# Patient Record
Sex: Male | Born: 1970
Health system: Southern US, Community
[De-identification: ages and names within clinical notes are randomized; demographics above are authoritative.]

## PROBLEM LIST (undated history)

## (undated) DIAGNOSIS — E785 Hyperlipidemia, unspecified: Secondary | ICD-10-CM

## (undated) DIAGNOSIS — I10 Essential (primary) hypertension: Secondary | ICD-10-CM

## (undated) HISTORY — DX: Essential (primary) hypertension: I10

## (undated) HISTORY — DX: Hyperlipidemia, unspecified: E78.5

## (undated) HISTORY — PX: COLONOSCOPY: SHX174

---

## 1999-05-25 ENCOUNTER — Encounter: Payer: Self-pay | Admitting: Family Medicine

## 1999-05-25 ENCOUNTER — Ambulatory Visit (HOSPITAL_COMMUNITY): Admission: RE | Admit: 1999-05-25 | Discharge: 1999-05-25 | Payer: Self-pay | Admitting: Family Medicine

## 1999-05-26 ENCOUNTER — Emergency Department (HOSPITAL_COMMUNITY): Admission: EM | Admit: 1999-05-26 | Discharge: 1999-05-26 | Payer: Self-pay | Admitting: Emergency Medicine

## 2000-03-28 ENCOUNTER — Emergency Department (HOSPITAL_COMMUNITY): Admission: EM | Admit: 2000-03-28 | Discharge: 2000-03-28 | Payer: Self-pay | Admitting: Emergency Medicine

## 2010-03-14 HISTORY — PX: ROTATOR CUFF REPAIR: SHX139

## 2010-03-25 ENCOUNTER — Ambulatory Visit (HOSPITAL_COMMUNITY)
Admission: RE | Admit: 2010-03-25 | Discharge: 2010-03-25 | Payer: Self-pay | Source: Home / Self Care | Attending: Specialist | Admitting: Specialist

## 2010-11-08 ENCOUNTER — Encounter: Payer: Self-pay | Admitting: Critical Care Medicine

## 2010-11-09 ENCOUNTER — Institutional Professional Consult (permissible substitution): Payer: Self-pay | Admitting: Critical Care Medicine

## 2010-11-09 ENCOUNTER — Ambulatory Visit (INDEPENDENT_AMBULATORY_CARE_PROVIDER_SITE_OTHER): Payer: BC Managed Care – PPO | Admitting: Critical Care Medicine

## 2010-11-09 ENCOUNTER — Encounter: Payer: Self-pay | Admitting: Critical Care Medicine

## 2010-11-09 ENCOUNTER — Ambulatory Visit (INDEPENDENT_AMBULATORY_CARE_PROVIDER_SITE_OTHER)
Admission: RE | Admit: 2010-11-09 | Discharge: 2010-11-09 | Disposition: A | Discharge status: Home / Self Care | Payer: BC Managed Care – PPO | Source: Ambulatory Visit | Attending: Critical Care Medicine | Admitting: Critical Care Medicine

## 2010-11-09 DIAGNOSIS — J019 Acute sinusitis, unspecified: Secondary | ICD-10-CM | POA: Insufficient documentation

## 2010-11-09 DIAGNOSIS — J209 Acute bronchitis, unspecified: Secondary | ICD-10-CM

## 2010-11-09 DIAGNOSIS — F17200 Nicotine dependence, unspecified, uncomplicated: Secondary | ICD-10-CM | POA: Insufficient documentation

## 2010-11-09 DIAGNOSIS — Z720 Tobacco use: Secondary | ICD-10-CM

## 2010-11-09 MED ORDER — VARENICLINE TARTRATE 0.5 MG PO TABS: 0.5000 mg | ORAL_TABLET | Freq: Two times a day (BID) | ORAL | Status: AC

## 2010-11-09 MED ORDER — BUDESONIDE 180 MCG/ACT IN AEPB: 2.0000 | INHALATION_SPRAY | Freq: Two times a day (BID) | RESPIRATORY_TRACT | Status: DC

## 2010-11-09 MED ORDER — PREDNISONE 10 MG PO TABS: ORAL_TABLET | ORAL | Status: DC

## 2010-11-09 NOTE — Assessment & Plan Note (Addendum)
Acute tracheobronchitis with airway obstruction and cyclical cough , assoc chronic sinusitis ppt by smoking use Note obstruction on spiro 8/12  Plan Start pulmicort two puff twice daily Prednisone 10mg  Take 4 for two days, three for two days, two for two days, then one for two days and stop Finish azithromycin Stop smoking , use chantix Proair as needed Return 2 months

## 2010-11-09 NOTE — Assessment & Plan Note (Signed)
Trial chantix for smoking cessation

## 2010-11-09 NOTE — Progress Notes (Signed)
Subjective:    Patient ID: Ryan Fisher, male    DOB: Aug 17, 1970, 40 y.o.   MRN: [id number] 40 y.o. WM chronic cough Cough This is a chronic problem. The current episode started more than 1 year ago. The problem has been waxing and waning (comes and goes,  once a year, occ weekly.). The problem occurs every few hours. The cough is productive of sputum (Is productive of mucus). Associated symptoms include chills, ear pain, a fever, headaches, myalgias, postnasal drip, rhinorrhea, shortness of breath and wheezing. Pertinent negatives include no chest pain, heartburn, hemoptysis, nasal congestion, rash or sore throat. The symptoms are aggravated by fumes and dust. Risk factors for lung disease include smoking/tobacco exposure. He has tried a beta-agonist inhaler (inhaler does not help much.) for the symptoms. The treatment provided mild relief. His past medical history is significant for bronchitis. There is no history of asthma, COPD, emphysema, environmental allergies or pneumonia.    Past Medical History  Diagnosis Date  . Acute bronchitis   . Acute serous otitis media   . Acute sinusitis, unspecified   . Pain in joint, shoulder region      Family History  Problem Relation Age of Onset  . Allergies Brother   . Asthma Brother   . Heart failure Paternal Grandfather   . Brain cancer Maternal Aunt   . Arthritis Mother      History   Social History  . Marital Status: Single    Spouse Name: N/A    Number of Children: N/A  . Years of Education: N/A   Occupational History  . 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 Smoking status: Current Everyday Smoker -- 1.0 packs/day    Types: Cigarettes, Cigars  . Smokeless tobacco: Former Neurosurgeon    Types: Snuff    Quit date: 03/14/2009   Comment: started smoking at age 59  . Alcohol Use: Yes     every couple weeks  . Drug Use: No     quit at age 58  . Sexually Active: Not on file   Other Topics  Concern  . Not on file   Social History Narrative  . No narrative on file     Allergies  Allergen Reactions  . Morphine And Related      Outpatient Prescriptions Prior to Visit  Medication Sig Dispense Refill  . albuterol (PROAIR HFA) 108 (90 BASE) MCG/ACT inhaler Inhale 2 puffs into the lungs every 4 (four) hours as needed.        . cyclobenzaprine (FLEXERIL) 10 MG tablet Take 10 mg by mouth 3 (three) times daily.        . fluticasone (FLONASE) 50 MCG/ACT nasal spray Place 2 sprays into the nose daily.            Review of Systems  Constitutional: Positive for fever, chills and diaphoresis. Negative for activity change, appetite change, fatigue and unexpected weight change.  HENT: Positive for ear pain, congestion, rhinorrhea, trouble swallowing, dental problem, voice change, postnasal drip, sinus pressure and tinnitus. Negative for hearing loss, nosebleeds, sore throat, facial swelling, sneezing, mouth sores, neck pain, neck stiffness and ear discharge.   Eyes: Negative for photophobia, discharge, itching and visual disturbance.  Respiratory: Positive for cough, chest tightness, shortness of breath and wheezing. Negative for apnea, hemoptysis, choking and stridor.   Cardiovascular: Negative for chest pain, palpitations and leg swelling.  Gastrointestinal: Positive for nausea and constipation. Negative for heartburn,  vomiting, abdominal pain, blood in stool and abdominal distention.  Genitourinary: Positive for hematuria. Negative for dysuria, urgency, frequency, flank pain, decreased urine volume and difficulty urinating.  Musculoskeletal: Positive for myalgias, back pain and arthralgias. Negative for joint swelling and gait problem.  Skin: Positive for pallor. Negative for color change and rash.  Neurological: Positive for dizziness, weakness, light-headedness and headaches. Negative for tremors, seizures, syncope, speech difficulty and numbness.  Hematological: Negative for  environmental allergies and adenopathy. Does not bruise/bleed easily.  Psychiatric/Behavioral: Negative for confusion, sleep disturbance and agitation. The patient is not nervous/anxious.        Objective:   Physical Exam  Filed Vitals:   11/09/10 0949  BP: 122/86  Pulse: 79  Temp: 99 F (37.2 C)  Height: 6\' 3"  (1.905 m)  Weight: 301 lb 9.6 oz (136.805 kg)  SpO2: 97%    Gen: Pleasant, well-nourished, in no distress,  normal affect  ENT: bilateral nasal purulence,  mouth clear,  oropharynx clear, + postnasal drip  Neck: No JVD, no TMG, no carotid bruits  Lungs: No use of accessory muscles, no dullness to percussion, exp wheeze Cardiovascular: RRR, heart sounds normal, no murmur or gallops, no peripheral edema  Abdomen: soft and NT, no HSM,  BS normal  Musculoskeletal: No deformities, no cyanosis or clubbing  Neuro: alert, non focal  Skin: Warm, no lesions or rashes  8/28 CXR No active disease 8/28 spiro: moderate peripheral airflow obstruction      Assessment & Plan:   Acute bronchitis Acute tracheobronchitis with airway obstruction and cyclical cough , assoc chronic sinusitis ppt by smoking use Note obstruction on spiro 8/12  Plan Start pulmicort two puff twice daily Prednisone 10mg  Take 4 for two days, three for two days, two for two days, then one for two days and stop Finish azithromycin Stop smoking , use chantix Proair as needed Return 2 months   Nicotine addiction Trial chantix for smoking cessation    Updated Medication List Outpatient Encounter Prescriptions as of 11/09/2010  Medication Sig Dispense Refill  . albuterol (PROAIR HFA) 108 (90 BASE) MCG/ACT inhaler Inhale 2 puffs into the lungs every 4 (four) hours as needed.        Marland Kitchen azithromycin (ZITHROMAX) 250 MG tablet Take as directed      . cyclobenzaprine (FLEXERIL) 10 MG tablet Take 10 mg by mouth 3 (three) times daily.        . fluticasone (FLONASE) 50 MCG/ACT nasal spray Place 2 sprays  into the nose daily.        Marland Kitchen omeprazole (PRILOSEC) 20 MG capsule Take 1 capsule by mouth Daily. Take 30 minutes before breakfast      . budesonide (PULMICORT FLEXHALER) 180 MCG/ACT inhaler Inhale 2 puffs into the lungs 2 (two) times daily.  1 each  6  . predniSONE (DELTASONE) 10 MG tablet Take 4 for two days, three for two days, two for two days, then one for two days and stop   20 tablet  0  . varenicline (CHANTIX) 0.5 MG tablet Take 1 tablet (0.5 mg total) by mouth 2 (two) times daily.  60 tablet  2

## 2010-11-09 NOTE — Patient Instructions (Signed)
Start pulmicort two puff twice daily Prednisone 10mg  Take 4 for two days, three for two days, two for two days, then one for two days and stop Finish azithromycin Stop smoking , use chantix Proair as needed Return 2 months

## 2012-08-11 IMAGING — CR DG CHEST 2V
2 series · 2 of 2 positions shown · non-contrast
Comparison: 08/27/2007

CLINICAL DATA: Chronic cough.  Smoker.

CHEST - 2 VIEW

[view not recorded (1 of 2)]
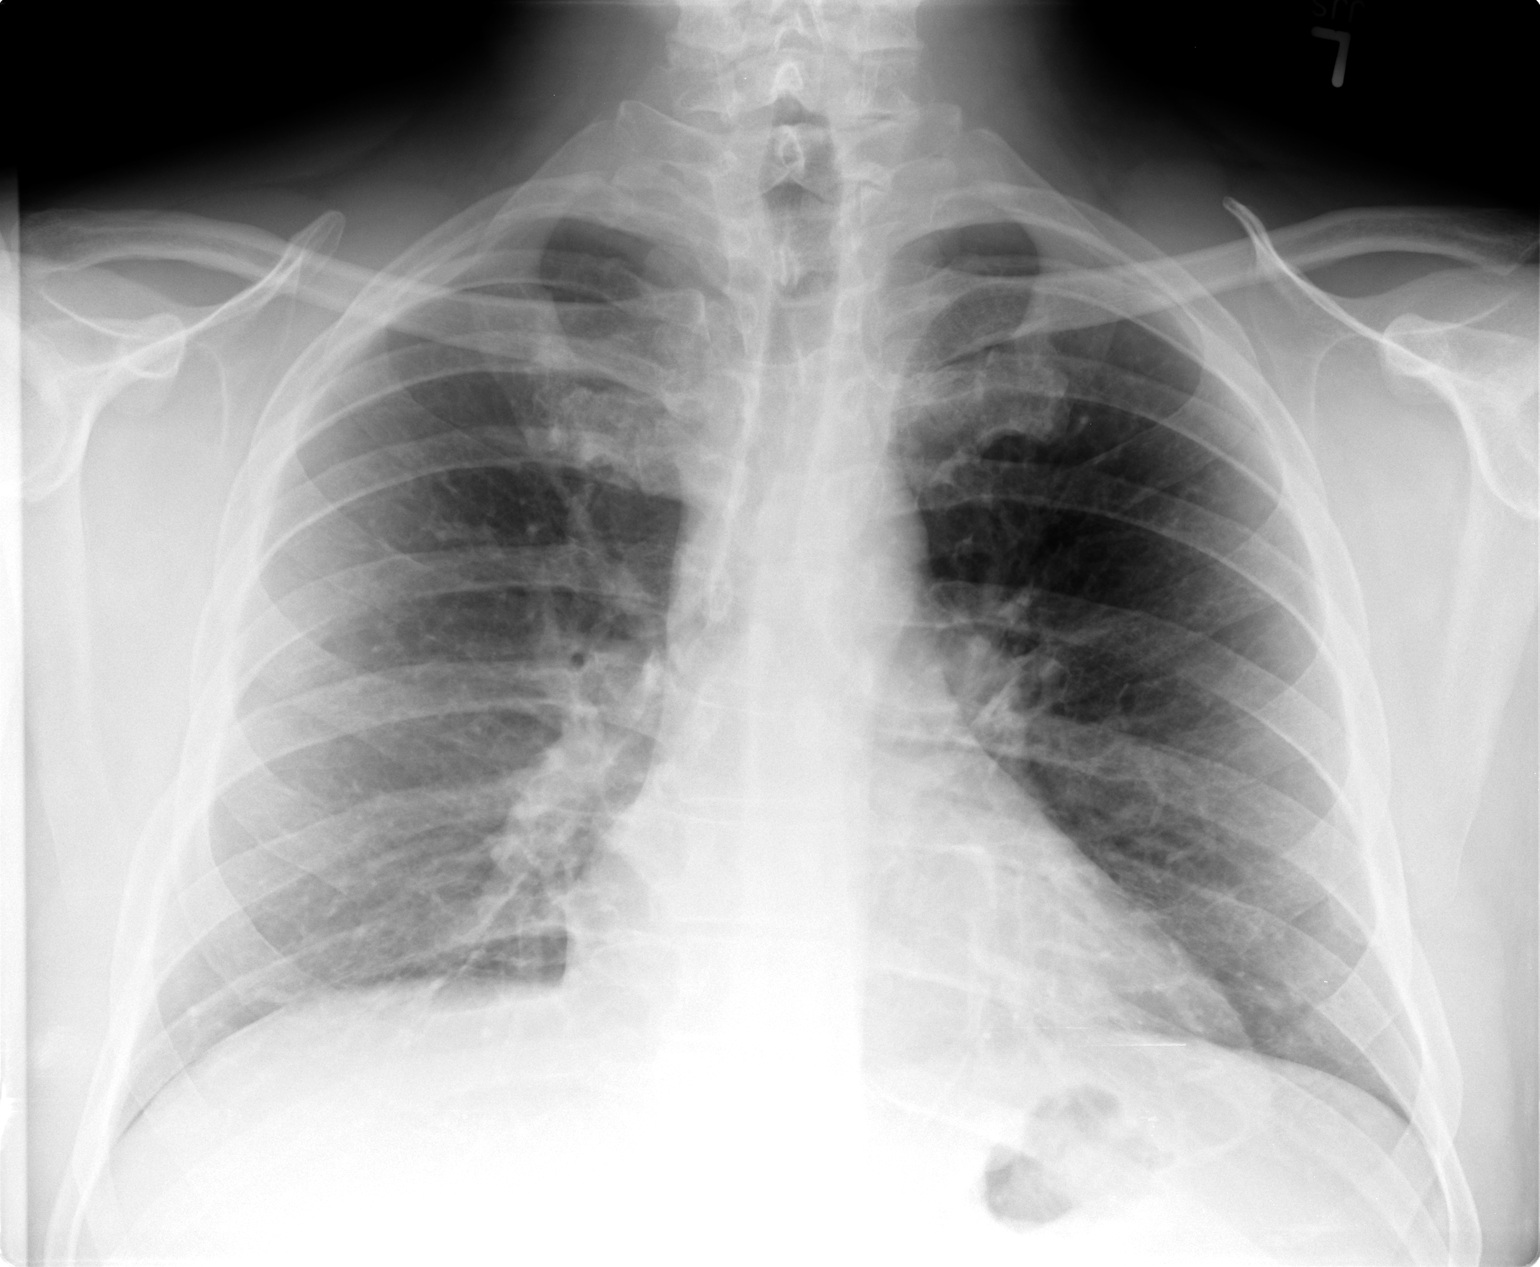

[view not recorded (2 of 2)]
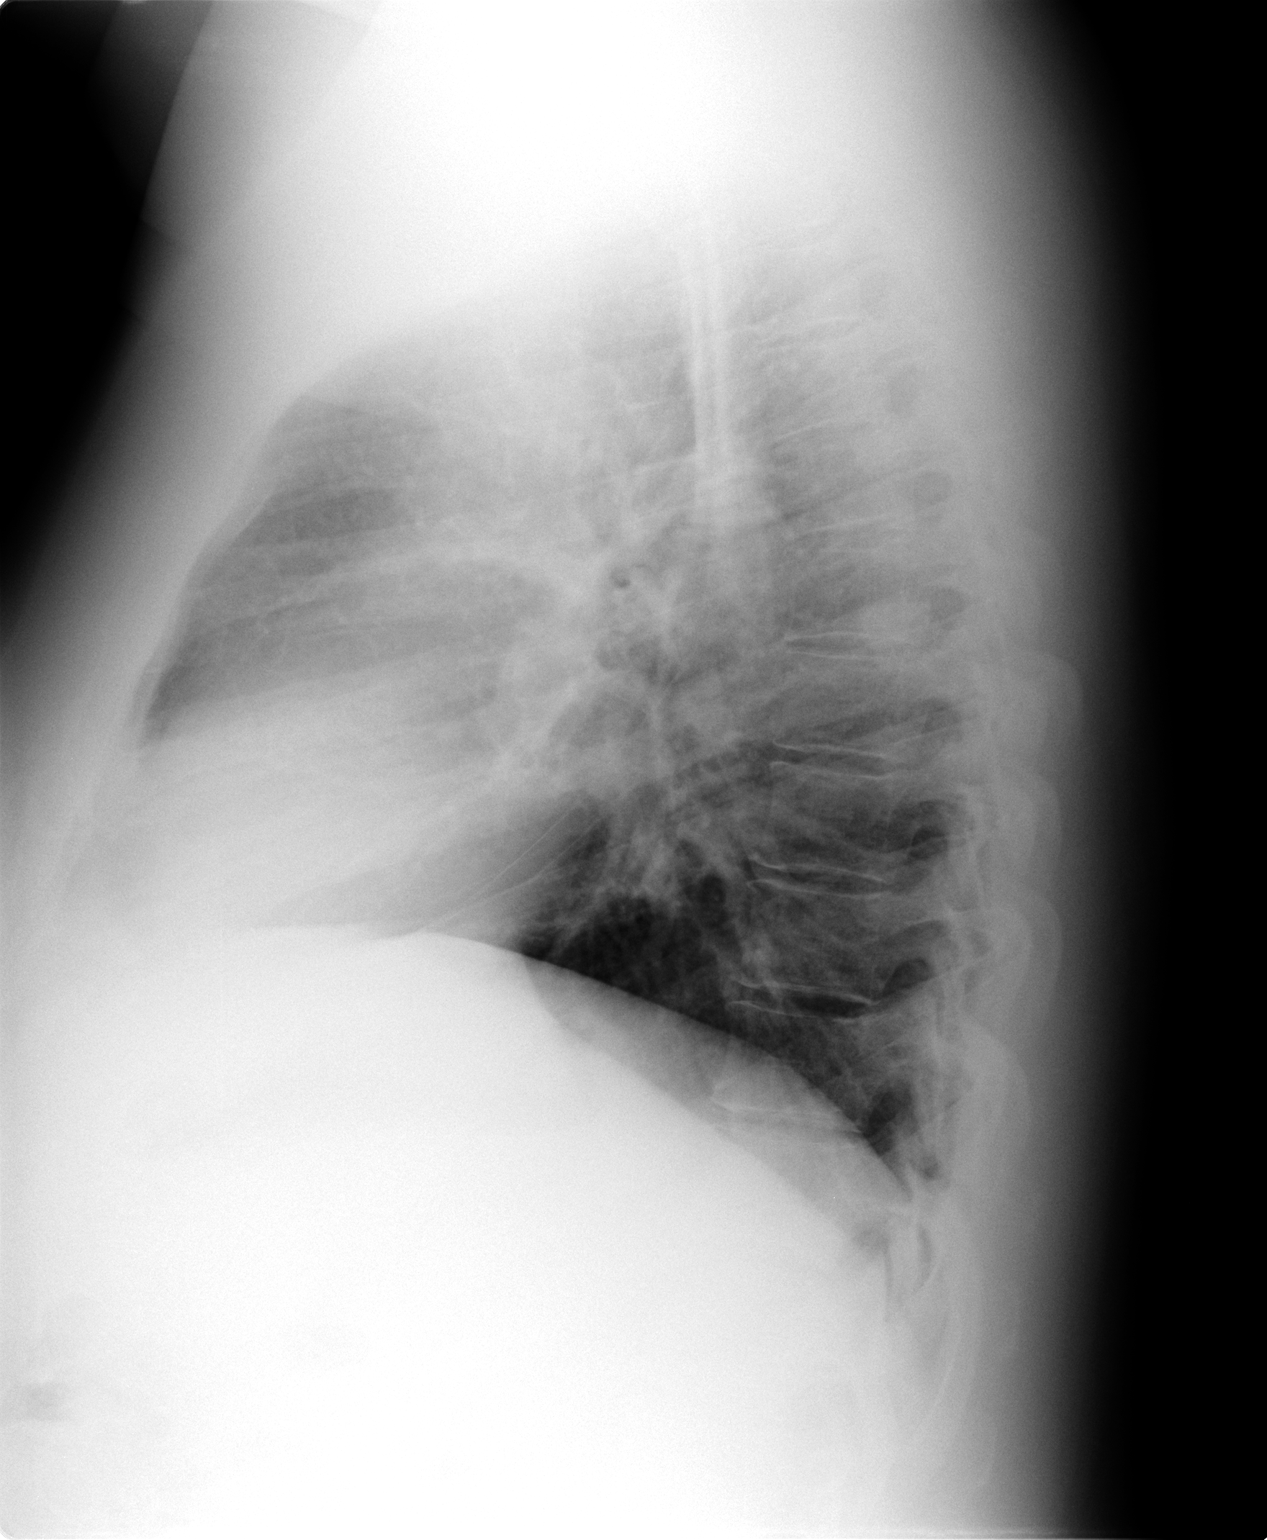

[2 of 2 positions shown; findings below may reference images not displayed]

FINDINGS: Low lung volumes are noted, however both lungs are clear.
No evidence of pleural effusion.  Heart size is within normal
limits.  No mass or lymphadenopathy identified.
IMPRESSION: Stable exam.  No active disease.

## 2016-04-28 DIAGNOSIS — J069 Acute upper respiratory infection, unspecified: Secondary | ICD-10-CM | POA: Diagnosis not present

## 2016-04-28 DIAGNOSIS — R062 Wheezing: Secondary | ICD-10-CM | POA: Diagnosis not present

## 2016-06-13 DIAGNOSIS — K0889 Other specified disorders of teeth and supporting structures: Secondary | ICD-10-CM | POA: Diagnosis not present

## 2016-08-30 ENCOUNTER — Ambulatory Visit (INDEPENDENT_AMBULATORY_CARE_PROVIDER_SITE_OTHER): Payer: BLUE CROSS/BLUE SHIELD | Admitting: Family Medicine

## 2016-08-30 ENCOUNTER — Encounter: Payer: Self-pay | Admitting: Family Medicine

## 2016-08-30 VITALS — BP 155/95 | HR 80 | Temp 98.3°F | Resp 20 | Ht 75.0 in | Wt 308.8 lb

## 2016-08-30 DIAGNOSIS — Z6838 Body mass index (BMI) 38.0-38.9, adult: Secondary | ICD-10-CM | POA: Diagnosis not present

## 2016-08-30 DIAGNOSIS — F17200 Nicotine dependence, unspecified, uncomplicated: Secondary | ICD-10-CM

## 2016-08-30 DIAGNOSIS — Z7689 Persons encountering health services in other specified circumstances: Secondary | ICD-10-CM

## 2016-08-30 DIAGNOSIS — I10 Essential (primary) hypertension: Secondary | ICD-10-CM | POA: Diagnosis not present

## 2016-08-30 DIAGNOSIS — Z87891 Personal history of nicotine dependence: Secondary | ICD-10-CM | POA: Insufficient documentation

## 2016-08-30 LAB — COMPREHENSIVE METABOLIC PANEL
ALBUMIN: 4.4 g/dL (ref 3.5–5.2)
ALT: 35 U/L (ref 0–53)
AST: 28 U/L (ref 0–37)
Alkaline Phosphatase: 68 U/L (ref 39–117)
BUN: 14 mg/dL (ref 6–23)
CHLORIDE: 106 meq/L (ref 96–112)
CO2: 28 meq/L (ref 19–32)
Calcium: 9.9 mg/dL (ref 8.4–10.5)
Creatinine, Ser: 1.19 mg/dL (ref 0.40–1.50)
GFR: 70.15 mL/min (ref >60.00)
GLUCOSE: 162 mg/dL — AB (ref 70–99)
POTASSIUM: 4 meq/L (ref 3.5–5.1)
SODIUM: 142 meq/L (ref 135–145)
Total Bilirubin: 0.6 mg/dL (ref 0.2–1.2)
Total Protein: 7 g/dL (ref 6.0–8.3)

## 2016-08-30 LAB — CBC
HCT: 45.9 % (ref 39.0–52.0)
Hemoglobin: 15.7 g/dL (ref 13.0–17.0)
MCHC: 34.3 g/dL (ref 30.0–36.0)
MCV: 89.1 fl (ref 78.0–100.0)
Platelets: 276 10*3/uL (ref 150.0–400.0)
RBC: 5.15 Mil/uL (ref 4.22–5.81)
RDW: 14.1 % (ref 11.5–15.5)
WBC: 8.6 10*3/uL (ref 4.0–10.5)

## 2016-08-30 LAB — TSH: TSH: 2.48 u[IU]/mL (ref 0.35–4.50)

## 2016-08-30 MED ORDER — BD ASSURE BPM/AUTO ARM CUFF MISC: 0 refills | Status: DC

## 2016-08-30 MED ORDER — LISINOPRIL 5 MG PO TABS: 5.0000 mg | ORAL_TABLET | Freq: Every day | ORAL | 0 refills | Status: DC

## 2016-08-30 NOTE — Progress Notes (Signed)
Patient ID: Ryan Fisher, male  DOB: [birth year], 46 y.o.   MRN: [id number] Patient Care Team    Relationship Specialty Notifications Start End  Ma Hillock, DO PCP - General Family Medicine  08/30/16   Elsie Stain, MD  Pulmonary Disease Abnormal results only, Admissions 11/09/10     Chief Complaint  Patient presents with  . Establish Care  . Hypertension    Subjective:  Ryan Fisher is a 46 y.o.  male present for new patient establishment. All past medical history, surgical history, allergies, family history, immunizations, medications and social history were obtained and entered in the electronic medical record today. All recent labs, ED visits and hospitalizations within the last year were reviewed.  New onset Hypertension:  Pt reports he has not had a primary care doctor. 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 They do not perform labs except urine for protein, which he states has always been normal. He reports his BPs have been borderline over the past couple years. However, the last physical his BP was 156/91 and was encouraged to establish with a PCP. Patient denies chest pain, shortness of breath or lower extremity edema. Pt does not take a  daily baby ASA. Pt is not prescribed statin. BMP: No prior labs CBC: no prior labs Lipids: no prior labs Diet: admits he has never watched his sodium.  Exercise: Does not exercise routinely.  RF: Smoker, HTN, FHX HD. Obesity.  Tobacco abuse disorder:  Pt reports he has smoker since he was about 16 or so averaging about 22 years of smoking over the years at 1 PPD. He tried to quit smoking around 2012 with chantix, but had severe side effects to medication including nightmares and mood changes. His wife also smokes, making it difficult to quit. He does smoke when wakes up in the morning. He does not desire to quit yet, but he would like to try sometime again if he can talk his wife into also quitting with him.   Health  maintenance:  Colonoscopy: No fhx, screen at 50. Immunizations: tdap 03/2013, Influenza (encouraged yearly), PNA series everyday smoker should be offered Infectious disease screening: HIV will offer at CPE PSA: No results found for: PSA, never. Will offer at cpe, no fhx.    Depression screen [facility] 2/9 08/30/2016  Decreased Interest 0  Down, Depressed, Hopeless 0  PHQ - 2 Score 0   No flowsheet data found.  Current Exercise Habits: The patient does not participate in regular exercise at present Exercise limited by: None identified No flowsheet data found.    There is no immunization history on file for this patient.  No exam data present  History reviewed. No pertinent past medical history. Allergies  Allergen Reactions  . Morphine And Related    Past Surgical History:  Procedure Laterality Date  . ROTATOR CUFF REPAIR Left 2012   left   Family History  Problem Relation Age of Onset  . Allergies Brother   . Asthma Brother   . Heart failure Paternal Grandfather   . Brain cancer Maternal Aunt   . Arthritis Mother   . Early death Paternal Uncle    Social History   Social History  . Marital status: Married    Spouse name: Lola  . Number of children: N/A  . Years of education: 72   Occupational History  . 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 Smoking status:  Former Smoker    Packs/day: 1.00    Years: 22.00    Types: Cigarettes, Cigars    Start date: 03/14/1994  . Smokeless tobacco: Former Systems developer    Types: Snuff    Quit date: 03/14/2009  . Alcohol use 3.6 oz/week    6 Cans of beer per week     Comment: every couple weeks  . Drug use: No     Comment: quit at age 72  . Sexual activity: Yes    Partners: Female   Other Topics Concern  . Not on file   Social History Narrative   Married to [place].    Some college attendance. 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    Drinks caffeine.    Wears a seatbelt and  bicycle helmet.    Smoke detector in the home. Firearms locked in the home.    Feels safe in his relationships.       Allergies as of 08/30/2016      Reactions   Morphine And Related       Medication List       Accurate as of 08/30/16  5:22 PM. Always use your most recent med list.          B-D ASSURE BPM/AUTO ARM CUFF Misc BP cuff XL.   lisinopril 5 MG tablet Commonly known as:  PRINIVIL,ZESTRIL Take 1 tablet (5 mg total) by mouth daily.       All past medical history, surgical history, allergies, family history, immunizations andmedications were updated in the EMR today and reviewed under the history and medication portions of their EMR.    Recent Results (from the past 2160 hour(s))  CBC     Status: None   Collection Time: 08/30/16  2:36 PM  Result Value Ref Range   WBC 8.6 4.0 - 10.5 K/uL   RBC 5.15 4.22 - 5.81 Mil/uL   Platelets 276.0 150.0 - 400.0 K/uL   Hemoglobin 15.7 13.0 - 17.0 g/dL   HCT 45.9 39.0 - 52.0 %   MCV 89.1 78.0 - 100.0 fl   MCHC 34.3 30.0 - 36.0 g/dL   RDW 14.1 11.5 - 15.5 %  TSH     Status: None   Collection Time: 08/30/16  2:36 PM  Result Value Ref Range   TSH 2.48 0.35 - 4.50 uIU/mL  Comp Met (CMET)     Status: Abnormal   Collection Time: 08/30/16  2:36 PM  Result Value Ref Range   Sodium 142 135 - 145 mEq/L   Potassium 4.0 3.5 - 5.1 mEq/L   Chloride 106 96 - 112 mEq/L   CO2 28 19 - 32 mEq/L   Glucose, Bld 162 (H) 70 - 99 mg/dL   BUN 14 6 - 23 mg/dL   Creatinine, Ser 1.19 0.40 - 1.50 mg/dL   Total Bilirubin 0.6 0.2 - 1.2 mg/dL   Alkaline Phosphatase 68 39 - 117 U/L   AST 28 0 - 37 U/L   ALT 35 0 - 53 U/L   Total Protein 7.0 6.0 - 8.3 g/dL   Albumin 4.4 3.5 - 5.2 g/dL   Calcium 9.9 8.4 - 10.5 mg/dL   GFR 70.15 >60.00 mL/min    Dg Chest 2 View  Result Date: 11/09/2010 *RADIOLOGY REPORT* Clinical Data: Chronic cough.  Smoker. CHEST - 2 VIEW Comparison: 08/27/2007 Findings: Low lung volumes are noted, however both lungs are clear.  No evidence of pleural effusion.  Heart size is within normal limits.  No  mass or lymphadenopathy identified. IMPRESSION: Stable exam.  No active disease. Original Report Authenticated By: Marlaine Hind, M.D.    ROS: 14 pt review of systems performed and negative (unless mentioned in an HPI)  Objective: BP (!) 155/95 (BP Location: Right Arm, Patient Position: Sitting, Cuff Size: Large)   Pulse 80   Temp 98.3 F (36.8 C)   Resp 20   Ht '6\' 3"'  (1.905 m)   Wt (!) 308 lb 12 oz (140 kg)   SpO2 97%   BMI 38.59 kg/m  Gen: Afebrile. No acute distress. Nontoxic in appearance, well-developed, well-nourished,  Obese, pleasant caucasian male.  HENT: AT. [place]. MMM. no Cough on exam, no hoarseness on exam. Eyes:Pupils Equal Round Reactive to light, Extraocular movements intact,  Conjunctiva without redness, discharge or icterus. Neck/lymp/endocrine: Supple,no lymphadenopathy, no thyromegaly CV: RRR no murmur, no edema, +2/4 P posterior tibialis pulses. no carotid bruits. No JVD. Chest: CTAB, no wheeze, rhonchi or crackles. normal Respiratory effort. Mildly diminished bilaterally  Air movement. Abd: Soft. obese. NTND. BS present.  hepatosplenomegaly. No rebound tenderness or guarding. Skin: Warm and well-perfused. Skin intact. Neuro/Msk:  Normal gait. PERLA. EOMi. Alert. Oriented x3.   Psych: Normal affect, dress and demeanor. Normal speech. Normal thought content and judgment.   Assessment/plan: Ryan Fisher is a 46 y.o. male present for establishment of care with new onset hypertension.  Essential hypertension - new. Start lisinopril 5 mg QD. Monitor BP at home (guidance on proper technique).  - BP cuff script provided. Goal 110-135/60-80 - low sodium, increase exercise.  - CBC - TSH - Comp Met (CMET) - Hemoglobin A1c - microalbumin collected at DOT physical and reported normal. Will request records.  - f/u in 1 week (can be CPE with fasting labs to get him caught up)  Tobacco use  disorder Patient was asked about smoking history. They were advised  to quit smoking in a clear, strong and personalized manner. Their willingness to quit smoking was assessed and felt to be in pre-contemplation phase. They were offered assistance in cessation and options were explained to them today.  Patient decided to wait, but is thinking about quitting and have his wife try to quit at the same time.  5 minutes spent today in counseling.  Pt advised to follow up when he is ready, we would be happy to help him.   BMI 38.0-38.9,adult - diet and exercise discussed.  - > 150 min exercise a week.  - low sodium.    Return in about 1 week (around 09/06/2016) for CPE.   Note is dictated utilizing voice recognition software. Although note has been proof read prior to signing, occasional typographical errors still can be missed. If any questions arise, please do not hesitate to call for verification.  Electronically signed by: Howard Pouch, DO [place]

## 2016-08-30 NOTE — Patient Instructions (Addendum)
Low sodium diet.  Exercise 150 min a week to start.  Start lisinopril 5 mg a day and follow up in 1 week (you can make this the physical appt--> come in fasting). Goal BP: 110-135/60-80  DASH Eating Plan DASH stands for "Dietary Approaches to Stop Hypertension." The DASH eating plan is a healthy eating plan that has been shown to reduce high blood pressure (hypertension). It may also reduce your risk for type 2 diabetes, heart disease, and stroke. The DASH eating plan may also help with weight loss. What are tips for following this plan? General guidelines  Avoid eating more than 2,300 mg (milligrams) of salt (sodium) a day. If you have hypertension, you may need to reduce your sodium intake to 1,500 mg a day.  Limit alcohol intake to no more than 1 drink a day for nonpregnant women and 2 drinks a day for men. One drink equals 12 oz of beer, 5 oz of wine, or 1 oz of hard liquor.  Work with your health care provider to maintain a healthy body weight or to lose weight. Ask what an ideal weight is for you.  Get at least 30 minutes of exercise that causes your heart to beat faster (aerobic exercise) most days of the week. Activities may include walking, swimming, or biking.  Work with your health care provider or diet and nutrition specialist (dietitian) to adjust your eating plan to your individual calorie needs. Reading food labels  Check food labels for the amount of sodium per serving. Choose foods with less than 5 percent of the Daily Value of sodium. Generally, foods with less than 300 mg of sodium per serving fit into this eating plan.  To find whole grains, look for the word "whole" as the first word in the ingredient list. Shopping  Buy products labeled as "low-sodium" or "no salt added."  Buy fresh foods. Avoid canned foods and premade or frozen meals. Cooking  Avoid adding salt when cooking. Use salt-free seasonings or herbs instead of table salt or sea salt. Check with your  health care provider or pharmacist before using salt substitutes.  Do not fry foods. Cook foods using healthy methods such as baking, boiling, grilling, and broiling instead.  Cook with heart-healthy oils, such as olive, canola, soybean, or sunflower oil. Meal planning   Eat a balanced diet that includes: ? 5 or more servings of fruits and vegetables each day. At each meal, try to fill half of your plate with fruits and vegetables. ? Up to 6-8 servings of whole grains each day. ? Less than 6 oz of lean meat, poultry, or fish each day. A 3-oz serving of meat is about the same size as a deck of cards. One egg equals 1 oz. ? 2 servings of low-fat dairy each day. ? A serving of nuts, seeds, or beans 5 times each week. ? Heart-healthy fats. Healthy fats called Omega-3 fatty acids are found in foods such as flaxseeds and coldwater fish, like sardines, salmon, and mackerel.  Limit how much you eat of the following: ? Canned or prepackaged foods. ? Food that is high in trans fat, such as fried foods. ? Food that is high in saturated fat, such as fatty meat. ? Sweets, desserts, sugary drinks, and other foods with added sugar. ? Full-fat dairy products.  Do not salt foods before eating.  Try to eat at least 2 vegetarian meals each week.  Eat more home-cooked food and less restaurant, buffet, and fast food.  When eating at a restaurant, ask that your food be prepared with less salt or no salt, if possible. What foods are recommended? The items listed may not be a complete list. Talk with your dietitian about what dietary choices are best for you. Grains Whole-grain or whole-wheat bread. Whole-grain or whole-wheat pasta. Brown rice. [place]. Bulgur. Whole-grain and low-sodium cereals. Pita bread. Low-fat, low-sodium crackers. Whole-wheat flour tortillas. Vegetables Fresh or frozen vegetables (raw, steamed, roasted, or grilled). Low-sodium or reduced-sodium tomato and vegetable juice.  Low-sodium or reduced-sodium tomato sauce and tomato paste. Low-sodium or reduced-sodium canned vegetables. Fruits All fresh, dried, or frozen fruit. Canned fruit in natural juice (without added sugar). Meat and other protein foods Skinless chicken or [place]. Ground chicken or [place]. Pork with fat trimmed off. Fish and seafood. Egg whites. Dried beans, peas, or lentils. Unsalted nuts, nut butters, and seeds. Unsalted canned beans. Lean cuts of beef with fat trimmed off. Low-sodium, lean deli meat. Dairy Low-fat (1%) or fat-free (skim) milk. Fat-free, low-fat, or reduced-fat cheeses. Nonfat, low-sodium ricotta or cottage cheese. Low-fat or nonfat yogurt. Low-fat, low-sodium cheese. Fats and oils Soft margarine without trans fats. Vegetable oil. Low-fat, reduced-fat, or light mayonnaise and salad dressings (reduced-sodium). Canola, safflower, olive, soybean, and sunflower oils. Avocado. Seasoning and other foods Herbs. Spices. Seasoning mixes without salt. Unsalted popcorn and pretzels. Fat-free sweets. What foods are not recommended? The items listed may not be a complete list. Talk with your dietitian about what dietary choices are best for you. Grains Baked goods made with fat, such as croissants, muffins, or some breads. Dry pasta or rice meal packs. Vegetables Creamed or fried vegetables. Vegetables in a cheese sauce. Regular canned vegetables (not low-sodium or reduced-sodium). Regular canned tomato sauce and paste (not low-sodium or reduced-sodium). Regular tomato and vegetable juice (not low-sodium or reduced-sodium). Angie Fava. Olives. Fruits Canned fruit in a light or heavy syrup. Fried fruit. Fruit in cream or butter sauce. Meat and other protein foods Fatty cuts of meat. Ribs. Fried meat. Berniece Salines. Sausage. Bologna and other processed lunch meats. Salami. Fatback. Hotdogs. Bratwurst. Salted nuts and seeds. Canned beans with added salt. Canned or smoked fish. Whole eggs or egg yolks. Chicken  or [place] with skin. Dairy Whole or 2% milk, cream, and half-and-half. Whole or full-fat cream cheese. Whole-fat or sweetened yogurt. Full-fat cheese. Nondairy creamers. Whipped toppings. Processed cheese and cheese spreads. Fats and oils Butter. Stick margarine. Lard. Shortening. Ghee. Bacon fat. Tropical oils, such as coconut, palm kernel, or palm oil. Seasoning and other foods Salted popcorn and pretzels. Onion salt, garlic salt, seasoned salt, table salt, and sea salt. Worcestershire sauce. Tartar sauce. Barbecue sauce. Teriyaki sauce. Soy sauce, including reduced-sodium. Steak sauce. Canned and packaged gravies. Fish sauce. Oyster sauce. Cocktail sauce. Horseradish that you find on the shelf. Ketchup. Mustard. Meat flavorings and tenderizers. Bouillon cubes. Hot sauce and Tabasco sauce. Premade or packaged marinades. Premade or packaged taco seasonings. Relishes. Regular salad dressings. Where to find more information:  National Heart, Lung, and [place]: https://wilson-eaton.com/  American Heart Association: www.heart.org Summary  The DASH eating plan is a healthy eating plan that has been shown to reduce high blood pressure (hypertension). It may also reduce your risk for type 2 diabetes, heart disease, and stroke.  With the DASH eating plan, you should limit salt (sodium) intake to 2,300 mg a day. If you have hypertension, you may need to reduce your sodium intake to 1,500 mg a day.  When on the DASH eating plan,  aim to eat more fresh fruits and vegetables, whole grains, lean proteins, low-fat dairy, and heart-healthy fats.  Work with your health care provider or diet and nutrition specialist (dietitian) to adjust your eating plan to your individual calorie needs. This information is not intended to replace advice given to you by your health care provider. Make sure you discuss any questions you have with your health care provider. Document Released: 02/17/2011 Document Revised:  02/22/2016 Document Reviewed: 02/22/2016 Elsevier Interactive Patient Education  2017 [place].  Please help [place] help you:  We are honored you have chosen [facility] for your Primary Care home. Below you will find basic instructions that you may need to access in the future. Please help [place] help you by reading the instructions, which cover many of the frequent questions we experience.   Prescription refills and request:  -In order to allow more efficient response time, please call your pharmacy for all refills. They will forward the request electronically to [place]. This allows for the quickest possible response. Request left on a nurse line can take longer to refill, since these are checked as time allows between office patients and other phone calls.  - refill request can take up to 3-5 working days to complete.  - If request is sent electronically and request is appropiate, it is usually completed in 1-2 business days.  - all patients will need to be seen routinely for all chronic medical conditions requiring prescription medications (see follow-up below). If you are overdue for follow up on your condition, you will be asked to make an appointment and we will call in enough medication to cover you until your appointment (up to 30 days).  - all controlled substances will require a face to face visit to request/refill.  - if you desire your prescriptions to go through a new pharmacy, and have an active script at original pharmacy, you will need to call your pharmacy and have scripts transferred to new pharmacy. This is completed between the pharmacy locations and not by your provider.    Results: If any images or labs were ordered, it can take up to 1 week to get results depending on the test ordered and the lab/facility running and resulting the test. - Normal or stable results, which do not need further discussion, may be released to your mychart immediately with attached note to you. A call  may not be generated for normal results. Please make certain to sign up for mychart. If you have questions on how to activate your mychart you can call the front office.  - If your results need further discussion, our office will attempt to contact you via phone, and if unable to reach you after 2 attempts, we will release your abnormal result to your mychart with instructions.  - All results will be automatically released in mychart after 1 week.  - Your provider will provide you with explanation and instruction on all relevant material in your results. Please keep in mind, results and labs may appear confusing or abnormal to the untrained eye, but it does not mean they are actually abnormal for you personally. If you have any questions about your results that are not covered, or you desire more detailed explanation than what was provided, you should make an appointment with your provider to do so.   Our office handles many outgoing and incoming calls daily. If we have not contacted you within 1 week about your results, please check your mychart to  see if there is a message first and if not, then contact our office.  In helping with this matter, you help decrease call volume, and therefore allow [place] to be able to respond to patients needs more efficiently.   Acute office visits (sick visit):  An acute visit is intended for a new problem and are scheduled in shorter time slots to allow schedule openings for patients with new problems. This is the appropriate visit to discuss a new problem. In order to provide you with excellent quality medical care with proper time for you to explain your problem, have an exam and receive treatment with instructions, these appointments should be limited to one new problem per visit. If you experience a new problem, in which you desire to be addressed, please make an acute office visit, we save openings on the schedule to accommodate you. Please do not save your new problem  for any other type of visit, let us take care of it properly and quickly for you.   Follow up visits:  Depending on your condition(s) your provider will need to see you routinely in order to provide you with quality care and prescribe medication(s). Most chronic conditions (Example: hypertension, Diabetes, depression/anxiety... etc), require visits a couple times a year. Your provider will instruct you on proper follow up for your personal medical conditions and history. Please make certain to make follow up appointments for your condition as instructed. Failing to do so could result in lapse in your medication treatment/refills. If you request a refill, and are overdue to be seen on a condition, we will always provide you with a 30 day script (once) to allow you time to schedule.    Medicare wellness (well visit): - we have a wonderful Nurse Maudie Mercury), that will meet with you and provide you will yearly medicare wellness visits. These visits should occur yearly (can not be scheduled less than 1 calendar year apart) and cover preventive health, immunizations, advance directives and screenings you are entitled to yearly through your medicare benefits. Do not miss out on your entitled benefits, this is when medicare will pay for these benefits to be ordered for you.  These are strongly encouraged by your provider and is the appropriate type of visit to make certain you are up to date with all preventive health benefits. If you have not had your medicare wellness exam in the last 12 months, please make certain to schedule one by calling the office and schedule your medicare wellness with Maudie Mercury as soon as possible.   Yearly physical (well visit):  - Adults are recommended to be seen yearly for physicals. Check with your insurance and date of your last physical, most insurances require one calendar year between physicals. Physicals include all preventive health topics, screenings, medical exam and labs that are  appropriate for gender/age and history. You may have fasting labs needed at this visit. This is a well visit (not a sick visit), new problems should not be covered during this visit (see acute visit).  - Pediatric patients are seen more frequently when they are younger. Your provider will advise you on well child visit timing that is appropriate for your their age. - This is not a medicare wellness visit. Medicare wellness exams do not have an exam portion to the visit. Some medicare companies allow for a physical, some do not allow a yearly physical. If your medicare allows a yearly physical you can schedule the medicare wellness with our nurse Maudie Mercury and have  your physical with your provider after, on the same day. Please check with insurance for your full benefits.   Late Policy/No Shows:  - all new patients should arrive 15-30 minutes earlier than appointment to allow [place] time  to  obtain all personal demographics,  insurance information and for you to complete office paperwork. - All established patients should arrive 10-15 minutes earlier than appointment time to update all information and be checked in .  - In our best efforts to run on time, if you are late for your appointment you will be asked to either reschedule or if able, we will work you back into the schedule. There will be a wait time to work you back in the schedule,  depending on availability.  - If you are unable to make it to your appointment as scheduled, please call 24 hours ahead of time to allow [place] to fill the time slot with someone else who needs to be seen. If you do not cancel your appointment ahead of time, you may be charged a no show fee.

## 2016-08-31 ENCOUNTER — Telehealth: Payer: Self-pay | Admitting: Family Medicine

## 2016-08-31 LAB — HEMOGLOBIN A1C
HEMOGLOBIN A1C: 5.6 % (ref <5.7)
Mean Plasma Glucose: 114 mg/dL

## 2016-08-31 NOTE — Telephone Encounter (Signed)
Please call pt:  - all his labs look good.

## 2016-08-31 NOTE — Telephone Encounter (Signed)
Left message with lab results on patient voice mail per DPR. 

## 2016-09-06 ENCOUNTER — Ambulatory Visit (INDEPENDENT_AMBULATORY_CARE_PROVIDER_SITE_OTHER): Payer: BLUE CROSS/BLUE SHIELD | Admitting: Family Medicine

## 2016-09-06 ENCOUNTER — Encounter: Payer: Self-pay | Admitting: Family Medicine

## 2016-09-06 VITALS — BP 148/98 | HR 60 | Temp 97.9°F | Resp 20 | Ht 75.0 in | Wt 310.5 lb

## 2016-09-06 DIAGNOSIS — Z114 Encounter for screening for human immunodeficiency virus [HIV]: Secondary | ICD-10-CM

## 2016-09-06 DIAGNOSIS — I10 Essential (primary) hypertension: Secondary | ICD-10-CM

## 2016-09-06 DIAGNOSIS — Z6838 Body mass index (BMI) 38.0-38.9, adult: Secondary | ICD-10-CM

## 2016-09-06 DIAGNOSIS — F17200 Nicotine dependence, unspecified, uncomplicated: Secondary | ICD-10-CM

## 2016-09-06 DIAGNOSIS — Z23 Encounter for immunization: Secondary | ICD-10-CM

## 2016-09-06 DIAGNOSIS — Z0001 Encounter for general adult medical examination with abnormal findings: Secondary | ICD-10-CM | POA: Diagnosis not present

## 2016-09-06 LAB — LIPID PANEL
CHOLESTEROL: 195 mg/dL (ref 0–200)
HDL: 35.9 mg/dL — AB (ref >39.00)
LDL Cholesterol: 138 mg/dL — ABNORMAL HIGH (ref 0–99)
NONHDL: 159.58
Total CHOL/HDL Ratio: 5
Triglycerides: 107 mg/dL (ref 0.0–149.0)
VLDL: 21.4 mg/dL (ref 0.0–40.0)

## 2016-09-06 NOTE — Progress Notes (Signed)
Patient ID: Ryan Fisher, male  DOB: March 19, 1970, 46 y.o.   MRN: [id number] Patient Care Team    Relationship Specialty Notifications Start End  Ryan Hillock, DO PCP - General Family Medicine  08/30/16   Ryan Stain, MD  Pulmonary Disease Abnormal results only, Admissions 11/09/10     Chief Complaint  Patient presents with  . Annual Exam    Subjective:  Ryan Fisher is a 46 y.o. male present for CPE. All past medical history, surgical history, allergies, family history, immunizations, medications and social history were updated in the electronic medical record today. All recent labs, ED visits and hospitalizations within the last year were reviewed.  Hypertension: New diagnosis last week for patient and we are  tapering up on medication.  Pt reports compliance with lisinopril 5 mg QD. Blood pressures ranges at home 130/75 (best one) to 178/95 (worst). His average is 145/90. Patient denies chest pain, shortness of breath or lower extremity edema. Pt does not take a  daily baby ASA. Pt is not prescribed prescribed statin. BMP: 08/30/2016 unremarkable. CBC: 08/30/2016 unremarkable Lipid panel: no prior collections.  Diet: low sodium Exercise: not routinely RF: HTN, obesity, Fhx HD.   Health maintenance:  Colonoscopy: No fhx, screen at 50. Immunizations: tdap 03/2013, Influenza (encouraged yearly), Pneumovax  Infectious disease screening: HIV ordered and pt agreeable today.  PSA: no fhx.  Screen at 50. Assistive device: None Oxygen use: none Patient has a Dental home. Hospitalizations/ED visits: None  Depression screen [facility] 2/9 09/06/2016 08/30/2016  Decreased Interest 0 0  Down, Depressed, Hopeless 0 0  PHQ - 2 Score 0 0   No flowsheet data found.   Current Exercise Habits: The patient does not participate in regular exercise at present Exercise limited by: None identified Fall Risk  09/06/2016  Falls in the past year? No   Immunization History  Administered Date(s)  Administered  . Pneumococcal Polysaccharide-23 09/06/2016     Past Medical History:  Diagnosis Date  . Hypertension    Allergies  Allergen Reactions  . Morphine And Related    Past Surgical History:  Procedure Laterality Date  . ROTATOR CUFF REPAIR Left 2012   left   Family History  Problem Relation Age of Onset  . Allergies Brother   . Asthma Brother   . Heart failure Paternal Grandfather   . Brain cancer Maternal Aunt   . Arthritis Mother   . Early death Paternal Uncle    Social History   Social History  . Marital status: Married    Spouse name: Ryan Fisher  . Number of children: N/A  . Years of education: 24   Occupational History  . 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 Smoking status: Former Smoker    Packs/day: 1.00    Years: 22.00    Types: Cigarettes, Cigars    Start date: 03/14/1994  . Smokeless tobacco: Former Systems developer    Types: Snuff    Quit date: 03/14/2009  . Alcohol use 3.6 oz/week    6 Cans of beer per week     Comment: every couple weeks  . Drug use: No     Comment: quit at age 65  . Sexual activity: Yes    Partners: Female   Other Topics Concern  . Not on file   Social History Narrative   Married to [place].    Some college attendance. Works for [employer] as an Engineer, production  tech.    Drinks caffeine.    Wears a seatbelt and bicycle helmet.    Smoke detector in the home. Firearms locked in the home.    Feels safe in his relationships.       Allergies as of 09/06/2016      Reactions   Morphine And Related       Medication List       Accurate as of 09/06/16 11:31 AM. Always use your most recent med list.          lisinopril 5 MG tablet Commonly known as:  PRINIVIL,ZESTRIL Take 1 tablet (5 mg total) by mouth daily.      All past medical history, surgical history, allergies, family history, immunizations andmedications were updated in the EMR today and reviewed under the history and medication  portions of their EMR.     Recent Results (from the past 2160 hour(s))  CBC     Status: None   Collection Time: 08/30/16  2:36 PM  Result Value Ref Range   WBC 8.6 4.0 - 10.5 K/uL   RBC 5.15 4.22 - 5.81 Mil/uL   Platelets 276.0 150.0 - 400.0 K/uL   Hemoglobin 15.7 13.0 - 17.0 g/dL   HCT 45.9 39.0 - 52.0 %   MCV 89.1 78.0 - 100.0 fl   MCHC 34.3 30.0 - 36.0 g/dL   RDW 14.1 11.5 - 15.5 %  TSH     Status: None   Collection Time: 08/30/16  2:36 PM  Result Value Ref Range   TSH 2.48 0.35 - 4.50 uIU/mL  Comp Met (CMET)     Status: Abnormal   Collection Time: 08/30/16  2:36 PM  Result Value Ref Range   Sodium 142 135 - 145 mEq/L   Potassium 4.0 3.5 - 5.1 mEq/L   Chloride 106 96 - 112 mEq/L   CO2 28 19 - 32 mEq/L   Glucose, Bld 162 (H) 70 - 99 mg/dL   BUN 14 6 - 23 mg/dL   Creatinine, Ser 1.19 0.40 - 1.50 mg/dL   Total Bilirubin 0.6 0.2 - 1.2 mg/dL   Alkaline Phosphatase 68 39 - 117 U/L   AST 28 0 - 37 U/L   ALT 35 0 - 53 U/L   Total Protein 7.0 6.0 - 8.3 g/dL   Albumin 4.4 3.5 - 5.2 g/dL   Calcium 9.9 8.4 - 10.5 mg/dL   GFR 70.15 >60.00 mL/min  Hemoglobin A1c     Status: None   Collection Time: 08/30/16  2:36 PM  Result Value Ref Range   Hgb A1c MFr Bld 5.6 <5.7 %    Comment:   For the purpose of screening for the presence of diabetes:   <5.7%       Consistent with the absence of diabetes 5.7-6.4 %   Consistent with increased risk for diabetes (prediabetes) >=6.5 %     Consistent with diabetes   This assay result is consistent with a decreased risk of diabetes.   Currently, no consensus exists regarding use of hemoglobin A1c for diagnosis of diabetes in children.   According to American Diabetes Association (ADA) guidelines, hemoglobin A1c <7.0% represents optimal control in non-pregnant diabetic patients. Different metrics may apply to specific patient populations. Standards of Medical Care in Diabetes (ADA).      Mean Plasma Glucose 114 mg/dL    Dg Chest 2  View  Result Date: 11/09/2010 *RADIOLOGY REPORT* Clinical Data: Chronic cough.  Smoker. CHEST - 2 VIEW Comparison: 08/27/2007 Findings:  Low lung volumes are noted, however both lungs are clear. No evidence of pleural effusion.  Heart size is within normal limits.  No mass or lymphadenopathy identified. IMPRESSION: Stable exam.  No active disease. Original Report Authenticated By: Marlaine Hind, M.D.    ROS: 14 pt review of systems performed and negative (unless mentioned in an HPI)  Objective: BP (!) 148/98 (BP Location: Right Arm, Patient Position: Sitting, Cuff Size: Large)   Pulse 60   Temp 97.9 F (36.6 C)   Resp 20   Ht _0  (1.905 m)   Wt (!) 310 lb 8 oz (140.8 kg)   SpO2 97%   BMI 38.81 kg/m  Gen: Afebrile. No acute distress. Nontoxic in appearance, well-developed, well-nourished,  Very pleasant , obese caucasian male.  HENT: AT. [place]. Bilateral TM visualized and normal in appearance, normal external auditory canal. MMM, no oral lesions, adequate dentition. Bilateral nares within normal limits. Throat without erythema, ulcerations or exudates. no Cough on exam, no hoarseness on exam. Eyes:Pupils Equal Round Reactive to light, Extraocular movements intact,  Conjunctiva without redness, discharge or icterus. Neck/lymp/endocrine: Supple,no lymphadenopathy, no thyromegaly CV: RRR no murmur, no edema, +2/4 P posterior tibialis pulses. no carotid bruits. No JVD. Chest: CTAB, no wheeze, rhonchi or crackles. normal Respiratory effort. good Air movement. Abd: Soft. obese. NTND. BS present. no Masses palpated. No hepatosplenomegaly. No rebound tenderness or guarding. Skin: no rashes, purpura or petechiae. Warm and well-perfused. Skin intact. Neuro/Msk:  Normal gait. PERLA. EOMi. Alert. Oriented x3.  Cranial nerves II through XII intact. Muscle strength 5/5 upper/lower extremity. DTRs equal bilaterally. Psych: Normal affect, dress and demeanor. Normal speech. Normal thought content and  judgment.  No exam data present  Assessment/plan: Ryan Fisher is a 46 y.o. male present for CPE Encounter for general adult medical examination with abnormal findings BMI 38.0-38.9,adult Patient was encouraged to exercise greater than 150 minutes a week. Patient was encouraged to choose a diet filled with fresh fruits and vegetables, and lean meats. AVS provided to patient today for education/recommendation on gender specific health and safety maintenance. Colonoscopy: No fhx, screen at 50. Immunizations: tdap 03/2013, Influenza (encouraged yearly), Pneumovax ordered today, pt agreeable.  Infectious disease screening: HIV ordered and pt agreeable today.  PSA: no fhx.  Screen at 50.  Essential hypertension - New diagnosis last week, tapering up on medication and currently elevated above goal <135/85.  - Increase lisinopril from 5 mg to 10 mg a day.  - Continue home monitoring and record.  - low sodium, increase exercise.  - Lipid panel: Fhx HD present - nurse visit in 1 week to recheck BP. Once stable will follow every 6 months.   Screening for HIV (human immunodeficiency virus) - pt agreeable to screening today. - HIV antibody (with reflex)  Tobacco use disorder--> pt is aware of resources to quit and will let us know when he is ready. Pneumovax immunization provided today for smoking h/o.   Return in about 1 week (around 09/13/2016) for HTN', nurse visit.  Note is dictated utilizing voice recognition software. Although note has been proof read prior to signing, occasional typographical errors still can be missed. If any questions arise, please do not hesitate to call for verification.  Electronically signed by: Howard Pouch, DO [place]

## 2016-09-06 NOTE — Patient Instructions (Signed)
Increase lisinopril to 10 mg a day (2 pills). Nurse visit in 1 week to recheck.   Health Maintenance, Male A healthy lifestyle and preventive care is important for your health and wellness. Ask your health care provider about what schedule of regular examinations is right for you. What should I know about weight and diet? Eat a Healthy Diet  Eat plenty of vegetables, fruits, whole grains, low-fat dairy products, and lean protein.  Do not eat a lot of foods high in solid fats, added sugars, or salt.  Maintain a Healthy Weight Regular exercise can help you achieve or maintain a healthy weight. You should:  Do at least 150 minutes of exercise each week. The exercise should increase your heart rate and make you sweat (moderate-intensity exercise).  Do strength-training exercises at least twice a week.  Watch Your Levels of Cholesterol and Blood Lipids  Have your blood tested for lipids and cholesterol every 5 years starting at 45 years of age. If you are at high risk for heart disease, you should start having your blood tested when you are 45 years old. You may need to have your cholesterol levels checked more often if: ? Your lipid or cholesterol levels are high. ? You are older than 45 years of age. ? You are at high risk for heart disease.  What should I know about cancer screening? Many types of cancers can be detected early and may often be prevented. Lung Cancer  You should be screened every year for lung cancer if: ? You are a current smoker who has smoked for at least 30 years. ? You are a former smoker who has quit within the past 15 years.  Talk to your health care provider about your screening options, when you should start screening, and how often you should be screened.  Colorectal Cancer  Routine colorectal cancer screening usually begins at 45 years of age and should be repeated every 5-10 years until you are 45 years old. You may need to be screened more often if  early forms of precancerous polyps or small growths are found. Your health care provider may recommend screening at an earlier age if you have risk factors for colon cancer.  Your health care provider may recommend using home test kits to check for hidden blood in the stool.  A small camera at the end of a tube can be used to examine your colon (sigmoidoscopy or colonoscopy). This checks for the earliest forms of colorectal cancer.  Prostate and Testicular Cancer  Depending on your age and overall health, your health care provider may do certain tests to screen for prostate and testicular cancer.  Talk to your health care provider about any symptoms or concerns you have about testicular or prostate cancer.  Skin Cancer  Check your skin from head to toe regularly.  Tell your health care provider about any new moles or changes in moles, especially if: ? There is a change in a mole's size, shape, or color. ? You have a mole that is larger than a pencil eraser.  Always use sunscreen. Apply sunscreen liberally and repeat throughout the day.  Protect yourself by wearing long sleeves, pants, a wide-brimmed hat, and sunglasses when outside.  What should I know about heart disease, diabetes, and high blood pressure?  If you are 4-33 years of age, have your blood pressure checked every 3-5 years. If you are 44 years of age or older, have your blood pressure checked every year. You  should have your blood pressure measured twice-once when you are at a hospital or clinic, and once when you are not at a hospital or clinic. Record the average of the two measurements. To check your blood pressure when you are not at a hospital or clinic, you can use: ? An automated blood pressure machine at a pharmacy. ? A home blood pressure monitor.  Talk to your health care provider about your target blood pressure.  If you are between 58-41 years old, ask your health care provider if you should take aspirin to  prevent heart disease.  Have regular diabetes screenings by checking your fasting blood sugar level. ? If you are at a normal weight and have a low risk for diabetes, have this test once every three years after the age of 2. ? If you are overweight and have a high risk for diabetes, consider being tested at a younger age or more often.  A one-time screening for abdominal aortic aneurysm (AAA) by ultrasound is recommended for men aged 65-75 years who are current or former smokers. What should I know about preventing infection? Hepatitis B If you have a higher risk for hepatitis B, you should be screened for this virus. Talk with your health care provider to find out if you are at risk for hepatitis B infection. Hepatitis C Blood testing is recommended for:  Everyone born from [birth year] through 1965.  Anyone with known risk factors for hepatitis C.  Sexually Transmitted Diseases (STDs)  You should be screened each year for STDs including gonorrhea and chlamydia if: ? You are sexually active and are younger than 45 years of age. ? You are older than 45 years of age and your health care provider tells you that you are at risk for this type of infection. ? Your sexual activity has changed since you were last screened and you are at an increased risk for chlamydia or gonorrhea. Ask your health care provider if you are at risk.  Talk with your health care provider about whether you are at high risk of being infected with HIV. Your health care provider may recommend a prescription medicine to help prevent HIV infection.  What else can I do?  Schedule regular health, dental, and eye exams.  Stay current with your vaccines (immunizations).  Do not use any tobacco products, such as cigarettes, chewing tobacco, and e-cigarettes. If you need help quitting, ask your health care provider.  Limit alcohol intake to no more than 2 drinks per day. One drink equals 12 ounces of beer, 5 ounces of wine, or  1 ounces of hard liquor.  Do not use street drugs.  Do not share needles.  Ask your health care provider for help if you need support or information about quitting drugs.  Tell your health care provider if you often feel depressed.  Tell your health care provider if you have ever been abused or do not feel safe at home. This information is not intended to replace advice given to you by your health care provider. Make sure you discuss any questions you have with your health care provider. Document Released: 08/27/2007 Document Revised: 10/28/2015 Document Reviewed: 12/02/2014 Elsevier Interactive Patient Education  [employer].

## 2016-09-07 ENCOUNTER — Telehealth: Payer: Self-pay | Admitting: Family Medicine

## 2016-09-07 DIAGNOSIS — E785 Hyperlipidemia, unspecified: Secondary | ICD-10-CM

## 2016-09-07 LAB — HIV ANTIBODY (ROUTINE TESTING W REFLEX): HIV: NONREACTIVE

## 2016-09-07 MED ORDER — LISINOPRIL 5 MG PO TABS: 10.0000 mg | ORAL_TABLET | Freq: Every day | ORAL | 0 refills | Status: DC

## 2016-09-07 NOTE — Telephone Encounter (Signed)
Left message for patient to return call to review labs.

## 2016-09-07 NOTE — Telephone Encounter (Signed)
Please call pt: - HIV negative.  - His cholesterol looks ok. There is a calculation that is performed to recommend a cholesterol lowering medication to the appropriate patients and he does qualify, mostly because of his fhx heart disease in his grandfather, his smoking hx and new hypertension.  By calculation he has 12% CV risk score, which means 12% chance of MI/Stroke in the next 10 years.  - A cholesterol lowering med will help not only decrease his cholesterol (LDL closer to 100) but also helps stabilize the vessels themselves to decrease the CV risk to him.  - If agreeable to start will call in 3 months supply of Lipitor 40 mg QHS and will follow up in 3 months for repeat fasting labs/LFT with provider appt. He will be due for his HTN f/u at that time anyway.   Lipid Panel     Component Value Date/Time   CHOL 195 09/06/2016 1104   TRIG 107.0 09/06/2016 1104   HDL 35.90 (L) 09/06/2016 1104   CHOLHDL 5 09/06/2016 1104   VLDL 21.4 09/06/2016 1104   LDLCALC 138 (H) 09/06/2016 1104

## 2016-09-08 NOTE — Telephone Encounter (Signed)
Left message for patient to return call to review lab results.

## 2016-09-09 MED ORDER — ATORVASTATIN CALCIUM 40 MG PO TABS: 40.0000 mg | ORAL_TABLET | Freq: Every day | ORAL | 3 refills | Status: DC

## 2016-09-09 NOTE — Addendum Note (Signed)
Addended by: Howard Pouch A on: 09/09/2016 04:58 PM   Modules accepted: Orders

## 2016-09-09 NOTE — Telephone Encounter (Signed)
Spoke with patient reviewed information and options with patient he is willing to start statin medication. Send to [place]. He understands instructions for 3 month follow up and fasting labs.

## 2016-09-13 ENCOUNTER — Ambulatory Visit (INDEPENDENT_AMBULATORY_CARE_PROVIDER_SITE_OTHER): Payer: BLUE CROSS/BLUE SHIELD | Admitting: Family Medicine

## 2016-09-13 ENCOUNTER — Telehealth: Payer: Self-pay | Admitting: Family Medicine

## 2016-09-13 ENCOUNTER — Encounter: Payer: Self-pay | Admitting: Family Medicine

## 2016-09-13 VITALS — BP 137/93 | HR 80 | Temp 98.0°F | Resp 16 | Wt 313.0 lb

## 2016-09-13 DIAGNOSIS — E785 Hyperlipidemia, unspecified: Secondary | ICD-10-CM | POA: Diagnosis not present

## 2016-09-13 DIAGNOSIS — I10 Essential (primary) hypertension: Secondary | ICD-10-CM

## 2016-09-13 MED ORDER — LISINOPRIL 20 MG PO TABS: 20.0000 mg | ORAL_TABLET | Freq: Every day | ORAL | 1 refills | Status: DC

## 2016-09-13 NOTE — Telephone Encounter (Signed)
[facility] - Client [facility]  Patient Name: Ryan Fisher  DOB: 06/22/1970    Initial Comment Caller says, has an appt this after noon to check vital for a new blood pressure Rx. Today bp it was 182/112 .. felt a little clammy and sweaty -- now the reading was 157/93 about 20-30 min later.    Nurse Assessment  Nurse: Harlow Mares, RN, Suanne Marker Date/Time Eilene Ghazi Time): 09/13/2016 11:22:06 AM  Confirm and document reason for call. If symptomatic, describe symptoms. ---Caller says, has an appt this after noon to check vital for a new blood pressure Rx. Today bp it was 182/112 .. felt a little clammy and sweaty -- now the reading was 157/93 about 20-30 min later.  Does the patient have any new or worsening symptoms? ---Yes  Will a triage be completed? ---Yes  Related visit to physician within the last 2 weeks? ---Yes  Does the PT have any chronic conditions? (i.e. diabetes, asthma, etc.) ---Yes  List chronic conditions. ---HTN; high chol  Is this a behavioral health or substance abuse call? ---No     Guidelines    Guideline Title Affirmed Question Affirmed Notes  High Blood Pressure [4] Systolic BP >= 585 OR Diastolic >= 90 AND [9] taking BP medications    Final Disposition User   See PCP within 2 Tillman Abide, RN, Suanne Marker    Referrals  REFERRED TO PCP OFFICE   Disagree/Comply: Leta Baptist

## 2016-09-13 NOTE — Patient Instructions (Signed)
Increase lisinopril to 20 mg a day.  Continue to monitor BP goal 110-135/60-85. Low sodium.   Make nurse visit for 1 week if BP reading at goal at home and not desiring new anxiety evaluation/med.  If BP above goal at home or desiring anxiety eval, need provider appt.      Hypertension Hypertension is another name for high blood pressure. High blood pressure forces your heart to work harder to pump blood. This can cause problems over time. There are two numbers in a blood pressure reading. There is a top number (systolic) over a bottom number (diastolic). It is best to have a blood pressure below 120/80. Healthy choices can help lower your blood pressure. You may need medicine to help lower your blood pressure if:  Your blood pressure cannot be lowered with healthy choices.  Your blood pressure is higher than 130/80.  Follow these instructions at home: Eating and drinking  If directed, follow the DASH eating plan. This diet includes: ? Filling half of your plate at each meal with fruits and vegetables. ? Filling one quarter of your plate at each meal with whole grains. Whole grains include whole wheat pasta, brown rice, and whole grain bread. ? Eating or drinking low-fat dairy products, such as skim milk or low-fat yogurt. ? Filling one quarter of your plate at each meal with low-fat (lean) proteins. Low-fat proteins include fish, skinless chicken, eggs, beans, and tofu. ? Avoiding fatty meat, cured and processed meat, or chicken with skin. ? Avoiding premade or processed food.  Eat less than 1,500 mg of salt (sodium) a day.  Limit alcohol use to no more than 1 drink a day for nonpregnant women and 2 drinks a day for men. One drink equals 12 oz of beer, 5 oz of wine, or 1 oz of hard liquor. Lifestyle  Work with your doctor to stay at a healthy weight or to lose weight. Ask your doctor what the best weight is for you.  Get at least 30 minutes of exercise that causes your heart to  beat faster (aerobic exercise) most days of the week. This may include walking, swimming, or biking.  Get at least 30 minutes of exercise that strengthens your muscles (resistance exercise) at least 3 days a week. This may include lifting weights or pilates.  Do not use any products that contain nicotine or tobacco. This includes cigarettes and e-cigarettes. If you need help quitting, ask your doctor.  Check your blood pressure at home as told by your doctor.  Keep all follow-up visits as told by your doctor. This is important. Medicines  Take over-the-counter and prescription medicines only as told by your doctor. Follow directions carefully.  Do not skip doses of blood pressure medicine. The medicine does not work as well if you skip doses. Skipping doses also puts you at risk for problems.  Ask your doctor about side effects or reactions to medicines that you should watch for. Contact a doctor if:  You think you are having a reaction to the medicine you are taking.  You have headaches that keep coming back (recurring).  You feel dizzy.  You have swelling in your ankles.  You have trouble with your vision. Get help right away if:  You get a very bad headache.  You start to feel confused.  You feel weak or numb.  You feel faint.  You get very bad pain in your: ? Chest. ? Belly (abdomen).  You throw up (vomit) more than  once.  You have trouble breathing. Summary  Hypertension is another name for high blood pressure.  Making healthy choices can help lower blood pressure. If your blood pressure cannot be controlled with healthy choices, you may need to take medicine. This information is not intended to replace advice given to you by your health care provider. Make sure you discuss any questions you have with your health care provider. Document Released: 08/17/2007 Document Revised: 01/27/2016 Document Reviewed: 01/27/2016 Elsevier Interactive Patient Education  [employer].

## 2016-09-13 NOTE — Telephone Encounter (Signed)
Patient has scheduled an appointment at 3:45 today with Dr. Raoul Pitch.

## 2016-09-13 NOTE — Progress Notes (Signed)
Patient ID: Ryan Fisher, male  DOB: [birth year], 46 y.o.   MRN: [id number] Patient Care Team    Relationship Specialty Notifications Start End  Ma Hillock, DO PCP - General Family Medicine  08/30/16   Elsie Stain, MD  Pulmonary Disease Abnormal results only, Admissions 11/09/10     Chief Complaint  Patient presents with  . Hypertension    Subjective:  Ryan Fisher is a 46 y.o. male present for follow up on new  hypertension   Hypertension/hyperlipidemia: New diagnosis last week for patient and we are  tapering up on medication currently on lisinopril 10 mg for 1 week. Pressures are still above goal by home reading 159-458 systolic 59-29W diastolic. His BP was checked at work today and it was 446 systolic. He does admit it his job is stressful and those higher readings may be from stress. He also reports they do not use a large cuff and it is a automatic read.  BMP: 08/30/2016 unremarkable. CBC: 08/30/2016 unremarkable Lipid panel: OK. LDL 138, started on statin secondary to CV risk.  Diet: low sodium Exercise: not routinely RF: HTN, obesity, Fhx HD.    Depression screen [facility] 2/9 09/06/2016 08/30/2016  Decreased Interest 0 0  Down, Depressed, Hopeless 0 0  PHQ - 2 Score 0 0   No flowsheet data found.       Fall Risk  09/06/2016  Falls in the past year? No   Immunization History  Administered Date(s) Administered  . Pneumococcal Polysaccharide-23 09/06/2016     Past Medical History:  Diagnosis Date  . Hyperlipidemia   . Hypertension    Allergies  Allergen Reactions  . Morphine And Related    Past Surgical History:  Procedure Laterality Date  . ROTATOR CUFF REPAIR Left 2012   left   Family History  Problem Relation Age of Onset  . Allergies Brother   . Asthma Brother   . Heart failure Paternal Grandfather   . Brain cancer Maternal Aunt   . Arthritis Mother   . Early death Paternal Uncle    Social History   Social History  . Marital status:  Married    Spouse name: Ryan Fisher  . Number of children: N/A  . Years of education: 107   Occupational History  . 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 Smoking status: Former Smoker    Packs/day: 1.00    Years: 22.00    Types: Cigarettes, Cigars    Start date: 03/14/1994  . Smokeless tobacco: Former Systems developer    Types: Snuff    Quit date: 03/14/2009  . Alcohol use 3.6 oz/week    6 Cans of beer per week     Comment: every couple weeks  . Drug use: No     Comment: quit at age 17  . Sexual activity: Yes    Partners: Female   Other Topics Concern  . Not on file   Social History Narrative   Married to [place].    Some college attendance. 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    Drinks caffeine.    Wears a seatbelt and bicycle helmet.    Smoke detector in the home. Firearms locked in the home.    Feels safe in his relationships.       Allergies as of 09/13/2016      Reactions   Morphine And Related       Medication List  Accurate as of 09/13/16  4:11 PM. Always use your most recent med list.          atorvastatin 40 MG tablet Commonly known as:  LIPITOR Take 1 tablet (40 mg total) by mouth daily.   lisinopril 20 MG tablet Commonly known as:  PRINIVIL,ZESTRIL Take 1 tablet (20 mg total) by mouth daily.      All past medical history, surgical history, allergies, family history, immunizations andmedications were updated in the EMR today and reviewed under the history and medication portions of their EMR.     Recent Results (from the past 2160 hour(s))  CBC     Status: None   Collection Time: 08/30/16  2:36 PM  Result Value Ref Range   WBC 8.6 4.0 - 10.5 K/uL   RBC 5.15 4.22 - 5.81 Mil/uL   Platelets 276.0 150.0 - 400.0 K/uL   Hemoglobin 15.7 13.0 - 17.0 g/dL   HCT 45.9 39.0 - 52.0 %   MCV 89.1 78.0 - 100.0 fl   MCHC 34.3 30.0 - 36.0 g/dL   RDW 14.1 11.5 - 15.5 %  TSH     Status: None   Collection Time: 08/30/16   2:36 PM  Result Value Ref Range   TSH 2.48 0.35 - 4.50 uIU/mL  Comp Met (CMET)     Status: Abnormal   Collection Time: 08/30/16  2:36 PM  Result Value Ref Range   Sodium 142 135 - 145 mEq/L   Potassium 4.0 3.5 - 5.1 mEq/L   Chloride 106 96 - 112 mEq/L   CO2 28 19 - 32 mEq/L   Glucose, Bld 162 (H) 70 - 99 mg/dL   BUN 14 6 - 23 mg/dL   Creatinine, Ser 1.19 0.40 - 1.50 mg/dL   Total Bilirubin 0.6 0.2 - 1.2 mg/dL   Alkaline Phosphatase 68 39 - 117 U/L   AST 28 0 - 37 U/L   ALT 35 0 - 53 U/L   Total Protein 7.0 6.0 - 8.3 g/dL   Albumin 4.4 3.5 - 5.2 g/dL   Calcium 9.9 8.4 - 10.5 mg/dL   GFR 70.15 >60.00 mL/min  Hemoglobin A1c     Status: None   Collection Time: 08/30/16  2:36 PM  Result Value Ref Range   Hgb A1c MFr Bld 5.6 <5.7 %    Comment:   For the purpose of screening for the presence of diabetes:   <5.7%       Consistent with the absence of diabetes 5.7-6.4 %   Consistent with increased risk for diabetes (prediabetes) >=6.5 %     Consistent with diabetes   This assay result is consistent with a decreased risk of diabetes.   Currently, no consensus exists regarding use of hemoglobin A1c for diagnosis of diabetes in children.   According to American Diabetes Association (ADA) guidelines, hemoglobin A1c <7.0% represents optimal control in non-pregnant diabetic patients. Different metrics may apply to specific patient populations. Standards of Medical Care in Diabetes (ADA).      Mean Plasma Glucose 114 mg/dL  HIV antibody (with reflex)     Status: None   Collection Time: 09/06/16 11:04 AM  Result Value Ref Range   HIV 1&2 Ab, 4th Generation NONREACTIVE NONREACTIVE    Comment:   HIV-1 antigen and HIV-1/HIV-2 antibodies were not detected.  There is no laboratory evidence of HIV infection.   HIV-1/2 Antibody Diff        Not indicated. HIV-1 RNA, Qual TMA  Not indicated.     PLEASE NOTE: This information has been disclosed to you from records whose  confidentiality may be protected by state law. If your state requires such protection, then the state law prohibits you from making any further disclosure of the information without the specific written consent of the person to whom it pertains, or as otherwise permitted by law. A general authorization for the release of medical or other information is NOT sufficient for this purpose.   The performance of this assay has not been clinically validated in patients less than 31 years old.   For additional information please refer to http://education.questdiagnostics.com/faq/FAQ106.  (This link is being provided for informational/educational purposes only.)     Lipid panel     Status: Abnormal   Collection Time: 09/06/16 11:04 AM  Result Value Ref Range   Cholesterol 195 0 - 200 mg/dL    Comment: ATP III Classification       Desirable:  < 200 mg/dL               Borderline High:  200 - 239 mg/dL          High:  > = 240 mg/dL   Triglycerides 107.0 0.0 - 149.0 mg/dL    Comment: Normal:  <150 mg/dLBorderline High:  150 - 199 mg/dL   HDL 35.90 (L) >39.00 mg/dL   VLDL 21.4 0.0 - 40.0 mg/dL   LDL Cholesterol 138 (H) 0 - 99 mg/dL   Total CHOL/HDL Ratio 5     Comment:                Men          Women1/2 Average Risk     3.4          3.3Average Risk          5.0          4.42X Average Risk          9.6          7.13X Average Risk          15.0          11.0                       NonHDL 159.58     Comment: NOTE:  Non-HDL goal should be 30 mg/dL higher than patient's LDL goal (i.e. LDL goal of < 70 mg/dL, would have non-HDL goal of < 100 mg/dL)    Dg Chest 2 View  Result Date: 11/09/2010 *RADIOLOGY REPORT* Clinical Data: Chronic cough.  Smoker. CHEST - 2 VIEW Comparison: 08/27/2007 Findings: Low lung volumes are noted, however both lungs are clear. No evidence of pleural effusion.  Heart size is within normal limits.  No mass or lymphadenopathy identified. IMPRESSION: Stable exam.  No active  disease. Original Report Authenticated By: Marlaine Hind, M.D.    ROS: 14 pt review of systems performed and negative (unless mentioned in an HPI)  Objective: BP (!) 137/93 (BP Location: Right Arm, Patient Position: Sitting, Cuff Size: Large)   Pulse 80   Temp 98 F (36.7 C) (Oral)   Resp 16   Wt (!) 313 lb (142 kg)   SpO2 95%   BMI 39.12 kg/m  Gen: Afebrile. No acute distress.  HENT: AT. [place].  MMM.  Eyes:Pupils Equal Round Reactive to light, Extraocular movements intact,  Conjunctiva without redness, discharge or icterus. CV: RRR no murmur, no edema, +2/4 P  posterior tibialis pulses Chest: CTAB, no wheeze or crackles Neuro:  Normal gait. PERLA. EOMi. Alert. Oriented.   No exam data present  Assessment/plan: Romelle Reiley is a 46 y.o. male present for CPE Essential hypertension/hyperlipidemia - Newer diagnosis tapering up on medication and currently elevated above goal.  - Increase lisinopril to 20 mg today - Continue home monitoring and record. Goal BP reviewed.  - low sodium, increase exercise.  - Lipid panel: collected 09/08/2016--> started Lipitor last week - nurse visit in 1 week to recheck BP, or provider appt if still not controlled on home monitoring (Or wants to consider anxiety medicine).  - F/U 3 months if BP normal on nurse visit.   Return in about 1 week (around 09/20/2016) for nurse visit.  Note is dictated utilizing voice recognition software. Although note has been proof read prior to signing, occasional typographical errors still can be missed. If any questions arise, please do not hesitate to call for verification.  Electronically signed by: Howard Pouch, DO [place]

## 2016-09-16 ENCOUNTER — Telehealth: Payer: Self-pay | Admitting: Family Medicine

## 2016-09-16 ENCOUNTER — Other Ambulatory Visit: Payer: Self-pay | Admitting: *Deleted

## 2016-09-16 MED ORDER — LISINOPRIL 20 MG PO TABS: 20.0000 mg | ORAL_TABLET | Freq: Every day | ORAL | 1 refills | Status: DC

## 2016-09-16 NOTE — Telephone Encounter (Signed)
Resent lisinopril Rx patient states it went to wrong pharmacy. Resent to [place]

## 2016-09-16 NOTE — Telephone Encounter (Signed)
Patient states pharmacy never received script for lisinopril (PRINIVIL,ZESTRIL) 20 MG tablet.  Please contact [facility] so patient can pick up rx today.

## 2016-09-20 ENCOUNTER — Ambulatory Visit (INDEPENDENT_AMBULATORY_CARE_PROVIDER_SITE_OTHER): Payer: BLUE CROSS/BLUE SHIELD

## 2016-09-20 VITALS — BP 130/82 | HR 86

## 2016-09-20 DIAGNOSIS — I10 Essential (primary) hypertension: Secondary | ICD-10-CM | POA: Diagnosis not present

## 2016-09-20 NOTE — Progress Notes (Addendum)
Patient presents today for BP check. BP 130/82 after sitting for 8 minutes.  Patient stated that he is feeling fine and Blood pressure has been running in this range. Dr. Raoul Pitch notified.  BP looks good. He will have his company fax release to [place] and I will be happy to sign for him.  Continue lisinopril 20 Mg QD Follow up 3 months as recommended at last visit. If still within normal limits at next appt. Can see every 6 months on chronic medical conditions  Electronically Signed by: Howard Pouch, DO [place] [facility] [place]

## 2017-12-12 DIAGNOSIS — J069 Acute upper respiratory infection, unspecified: Secondary | ICD-10-CM | POA: Diagnosis not present

## 2018-04-13 DIAGNOSIS — J101 Influenza due to other identified influenza virus with other respiratory manifestations: Secondary | ICD-10-CM | POA: Diagnosis not present

## 2018-04-24 ENCOUNTER — Encounter: Payer: Self-pay | Admitting: Family Medicine

## 2018-04-24 ENCOUNTER — Ambulatory Visit: Payer: BLUE CROSS/BLUE SHIELD | Admitting: Family Medicine

## 2018-04-24 VITALS — BP 150/120 | HR 80 | Temp 98.1°F | Resp 16 | Ht 75.0 in | Wt 321.0 lb

## 2018-04-24 DIAGNOSIS — M7731 Calcaneal spur, right foot: Secondary | ICD-10-CM

## 2018-04-24 DIAGNOSIS — M545 Low back pain, unspecified: Secondary | ICD-10-CM

## 2018-04-24 DIAGNOSIS — M79671 Pain in right foot: Secondary | ICD-10-CM | POA: Diagnosis not present

## 2018-04-24 DIAGNOSIS — E785 Hyperlipidemia, unspecified: Secondary | ICD-10-CM | POA: Diagnosis not present

## 2018-04-24 DIAGNOSIS — I10 Essential (primary) hypertension: Secondary | ICD-10-CM | POA: Diagnosis not present

## 2018-04-24 MED ORDER — METHYLPREDNISOLONE ACETATE 80 MG/ML IJ SUSP
80.0000 mg | Freq: Once | INTRAMUSCULAR | Status: AC
  Administered 2018-04-24: 80 mg via INTRAMUSCULAR

## 2018-04-24 MED ORDER — LISINOPRIL 20 MG PO TABS: 20.0000 mg | ORAL_TABLET | Freq: Every day | ORAL | 0 refills | Status: DC

## 2018-04-24 MED ORDER — CYCLOBENZAPRINE HCL 10 MG PO TABS: 10.0000 mg | ORAL_TABLET | Freq: Two times a day (BID) | ORAL | 0 refills | Status: DC | PRN

## 2018-04-24 MED ORDER — TRAMADOL HCL 50 MG PO TABS: 100.0000 mg | ORAL_TABLET | Freq: Three times a day (TID) | ORAL | 0 refills | Status: DC | PRN

## 2018-04-24 NOTE — Patient Instructions (Addendum)
Start prednisone tomorrow.  Flexeril at night, if does not make you tired you can use every 12 hrs.  Tramadol every 8 hrs if needed only for pain.   Xray of your right foot at [facility].   Start the lisinopril 20 mg daily. We will recheck at your coming appointment and may need to increase.  Follow up 2/20 or 21     Acute Back Pain, Adult Acute back pain is sudden and usually short-lived. It is often caused by an injury to the muscles and tissues in the back. The injury may result from:  A muscle or ligament getting overstretched or torn (strained). Ligaments are tissues that connect bones to each other. Lifting something improperly can cause a back strain.  Wear and tear (degeneration) of the spinal disks. Spinal disks are circular tissue that provides cushioning between the bones of the spine (vertebrae).  Twisting motions, such as while playing sports or doing yard work.  A hit to the back.  Arthritis. You may have a physical exam, lab tests, and imaging tests to find the cause of your pain. Acute back pain usually goes away with rest and home care. Follow these instructions at home: Managing pain, stiffness, and swelling  Take over-the-counter and prescription medicines only as told by your health care provider.  Your health care provider may recommend applying ice during the first 24-48 hours after your pain starts. To do this: ? Put ice in a plastic bag. ? Place a towel between your skin and the bag. ? Leave the ice on for 20 minutes, 2-3 times a day.  If directed, apply heat to the affected area as often as told by your health care provider. Use the heat source that your health care provider recommends, such as a moist heat pack or a heating pad. ? Place a towel between your skin and the heat source. ? Leave the heat on for 20-30 minutes. ? Remove the heat if your skin turns bright red. This is especially important if you are unable to feel pain, heat, or cold.  You have a greater risk of getting burned. Activity   Do not stay in bed. Staying in bed for more than 1-2 days can delay your recovery.  Sit up and stand up straight. Avoid leaning forward when you sit, or hunching over when you stand. ? If you work at a desk, sit close to it so you do not need to lean over. Keep your chin tucked in. Keep your neck drawn back, and keep your elbows bent at a right angle. Your arms should look like the letter "L." ? Sit high and close to the steering wheel when you drive. Add lower back (lumbar) support to your car seat, if needed.  Take short walks on even surfaces as soon as you are able. Try to increase the length of time you walk each day.  Do not sit, drive, or stand in one place for more than 30 minutes at a time. Sitting or standing for long periods of time can put stress on your back.  Do not drive or use heavy machinery while taking prescription pain medicine.  Use proper lifting techniques. When you bend and lift, use positions that put less stress on your back: ? [place] your knees. ? Keep the load close to your body. ? Avoid twisting.  Exercise regularly as told by your health care provider. Exercising helps your back heal faster and helps prevent back injuries by keeping  muscles strong and flexible.  Work with a physical therapist to make a safe exercise program, as recommended by your health care provider. Do any exercises as told by your physical therapist. Lifestyle  Maintain a healthy weight. Extra weight puts stress on your back and makes it difficult to have good posture.  Avoid activities or situations that make you feel anxious or stressed. Stress and anxiety increase muscle tension and can make back pain worse. Learn ways to manage anxiety and stress, such as through exercise. General instructions  Sleep on a firm mattress in a comfortable position. Try lying on your side with your knees slightly bent. If you lie on your back, put a  pillow under your knees.  Follow your treatment plan as told by your health care provider. This may include: ? Cognitive or behavioral therapy. ? Acupuncture or massage therapy. ? Meditation or yoga. Contact a health care provider if:  You have pain that is not relieved with rest or medicine.  You have increasing pain going down into your legs or buttocks.  Your pain does not improve after 2 weeks.  You have pain at night.  You lose weight without trying.  You have a fever or chills. Get help right away if:  You develop new bowel or bladder control problems.  You have unusual weakness or numbness in your arms or legs.  You develop nausea or vomiting.  You develop abdominal pain.  You feel faint. Summary  Acute back pain is sudden and usually short-lived.  Use proper lifting techniques. When you bend and lift, use positions that put less stress on your back.  Take over-the-counter and prescription medicines and apply heat or ice as directed by your health care provider. This information is not intended to replace advice given to you by your health care provider. Make sure you discuss any questions you have with your health care provider. Document Released: 02/28/2005 Document Revised: 10/05/2017 Document Reviewed: 10/12/2016 Elsevier Interactive Patient Education  2019 [employer].

## 2018-04-24 NOTE — Progress Notes (Signed)
Ryan Fisher , 03/18/1970, 48 y.o., male MRN: [id number] Patient Care Team    Relationship Specialty Notifications Start End  Ma Hillock, DO PCP - General Family Medicine  08/30/16   Elsie Stain, MD  Pulmonary Disease Abnormal results only, Admissions 11/09/10     Chief Complaint  Patient presents with  . Back Pain    x3 weeks. Pt twisted back the wrong way at home. Pt unable to bend over at waist. Radiating pain.  . Foot Pain    Right foot pain when walking at times. Stabbing pain.      Subjective: Pt presents for an OV with couple complaints.  Patient is known to this provider for management of his hypertension which he has been lost to follow-up for over a year and a half.  Hypertension/HLD/Obesity:  Pt reports non- compliance with lisinopril 20 mg daily. Blood pressures ranges at home are not checked. Patient denies chest pain, shortness of breath or lower extremity edema. Pt does not take a daily baby ASA. Pt was prescribed statin-  stopped medication.  She has not been seen since July 2018 for his blood pressure.  He has been out of medications for at least a year. BMP: 08/30/2016 unremarkable. CBC: 08/30/2016 unremarkable Lipid panel:  09/06/2016 LDL 138, started on statin secondary to CV risk.  Diet: low sodium Exercise: not routinely RF: HTN, obesity, Fhx HD.   Right foot pain:  New problem.  Patient states that he has had right foot pain for approximately 3 months.  He states the pain is on the bottom lateral posterior side near his heel.  He states the pain is worse at the end of his day after walking on it for some time.  He describes the pain as a stabbing and burning pain.  Lumbar pain: New problem.  Patient reports he has had left-sided low back pain without radiation for about 3 weeks.  He reports he twisted his back when he was unloading some lumbar and it has bothered him since.  He has tried heat and Advil with minimal relief.  He denies any radiation pain to  his lower extremity.  He denies any prior history of arthritis or surgeries on his lower back.  He recalls pulling a muscle in the same location around 2008.  But he has no problems since. Depression screen [facility] 2/9 09/06/2016 08/30/2016  Decreased Interest 0 0  Down, Depressed, Hopeless 0 0  PHQ - 2 Score 0 0    Allergies  Allergen Reactions  . Morphine And Related Other (See Comments)    Morphine only- sweating, flushing.    Social History   Social History Narrative   Married to [place].    Some college attendance. 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    Drinks caffeine.    Wears a seatbelt and bicycle helmet.    Smoke detector in the home. Firearms locked in the home.    Feels safe in his relationships.    Past Medical History:  Diagnosis Date  . Hyperlipidemia   . Hypertension    Past Surgical History:  Procedure Laterality Date  . ROTATOR CUFF REPAIR Left 2012   left   Family History  Problem Relation Age of Onset  . Allergies Brother   . Asthma Brother   . Heart failure Paternal Grandfather   . Brain cancer Maternal Aunt   . Arthritis Mother   . Early death Paternal Uncle    Allergies as of  04/24/2018      Reactions   Morphine And Related Other (See Comments)   Morphine only- sweating, flushing.       Medication List       Accurate as of April 24, 2018 11:59 PM. Always use your most recent med list.        atorvastatin 40 MG tablet Commonly known as:  LIPITOR Take 1 tablet (40 mg total) by mouth daily.   cyclobenzaprine 10 MG tablet Commonly known as:  FLEXERIL Take 1 tablet (10 mg total) by mouth 2 (two) times daily as needed for muscle spasms.   lisinopril 20 MG tablet Commonly known as:  PRINIVIL,ZESTRIL Take 1 tablet (20 mg total) by mouth daily.   traMADol 50 MG tablet Commonly known as:  ULTRAM Take 2 tablets (100 mg total) by mouth every 8 (eight) hours as needed.       All past medical history, surgical history, allergies, family  history, immunizations andmedications were updated in the EMR today and reviewed under the history and medication portions of their EMR.     ROS: Negative, with the exception of above mentioned in HPI   Objective:  BP (!) 150/120 (BP Location: Left Arm, Patient Position: Sitting, Cuff Size: Large)   Pulse 80   Temp 98.1 F (36.7 C) (Oral)   Resp 16   Ht 6\' 3"  (1.905 m)   Wt (!) 321 lb (145.6 kg)   SpO2 97%   BMI 40.12 kg/m  Body mass index is 40.12 kg/m. Gen: Afebrile. No acute distress. Nontoxic in appearance, well developed, well nourished.  HENT: AT. [place].  MMM, Eyes:Pupils Equal Round Reactive to light, Extraocular movements intact,  Conjunctiva without redness, discharge or icterus. Neck/lymp/endocrine: Supple, no lymphadenopathy CV: RRR no murmur, no edema Chest: CTAB, no wheeze or crackles. Good air movement, normal resp effort.  MSK:     Lumbar back: No erythema, no soft tissue swelling.  Muscle spasm left lower lumbar.  No SI pain.  Discomfort with left side bending, left rotation and extension.  Tolerated flexion without pain.  Muscle strength 5/5 bilateral lower extremity.  Negative straight leg raises.  Negative FABRE Neurovascular intact distally.    Right foot: No erythema, no soft tissue swelling.  Tender to palpation right lateral aspect of heel. Skin: no rashes, purpura or petechiae.  Neuro: Normal gait. PERLA. EOMi. Alert. Oriented x3    No exam data present Dg Foot Complete Right  Result Date: 04/26/2018 CLINICAL DATA:  Lateral heel pain for months with no injury. EXAM: RIGHT FOOT COMPLETE - 3+ VIEW COMPARISON:  None. FINDINGS: No fracture.  No bone lesion. Joints are normally spaced and aligned.  No arthropathic changes. Small dorsal and plantar calcaneal spurs. Soft tissues are unremarkable. IMPRESSION: 1. No fracture, bone lesion or joint abnormality. 2. Calcaneal spurs. Electronically Signed   By: Lajean Manes M.D.   On: 04/26/2018 08:45   No results found  for this or any previous visit (from the past 24 hour(s)).  Assessment/Plan: Ryan Fisher is a 48 y.o. male present for OV for  Lumbar pain -New problem.  Suspect lumbar strain. -He does appear uncomfortable today. -IM Depo-Medrol provided, prednisone taper to start tomorrow.  Flexeril twice daily as needed.  Advised him to use nightly at least for the next 7 days.  Caution on sedation. -Tramadol short course prescribed, no refills will be provided. - methylPREDNISolone acetate (DEPO-MEDROL) injection 80 mg -Follow-up next week  Intractable right heel pain/Heel spur, right  New problem.  Suspect heel spur as diagnosis.  X-ray confirmed.  Referred to podiatry. - DG Foot Complete Right; Future  Hyperlipidemia LDL goal <130/Essential hypertension/Morbid obesity (HCC) -Blood pressure uncontrolled.  Patient has not followed up in over a year and a half on his blood pressure regimen and has been noncompliant with lisinopril and atorvastatin. -Start lisinopril 20 mg daily.  Close follow-up with provider for recheck tapering of medication. -He was counseled on low-sodium diet, routine exercise and routine follow-up. -Discussed restart of atorvastatin on next appointment and collect labs. -Follow-up next week   Reviewed expectations re: course of current medical issues.  Discussed self-management of symptoms.  Outlined signs and symptoms indicating need for more acute intervention.  Patient verbalized understanding and all questions were answered.  Patient received an After-Visit Summary.    Orders Placed This Encounter  Procedures  . DG Foot Complete Right    Greater than 40 minutes spent with patient, >50% of time spent face to face counseling and/ coordinating care.    Note is dictated utilizing voice recognition software. Although note has been proof read prior to signing, occasional typographical errors still can be missed. If any questions arise, please do not hesitate to call  for verification.   electronically signed by:  Howard Pouch, DO  [place]

## 2018-04-25 ENCOUNTER — Ambulatory Visit (HOSPITAL_BASED_OUTPATIENT_CLINIC_OR_DEPARTMENT_OTHER)
Admission: RE | Admit: 2018-04-25 | Discharge: 2018-04-25 | Disposition: A | Discharge status: Home / Self Care | Payer: BLUE CROSS/BLUE SHIELD | Source: Ambulatory Visit | Attending: Family Medicine | Admitting: Family Medicine

## 2018-04-25 ENCOUNTER — Other Ambulatory Visit: Payer: Self-pay | Admitting: Family Medicine

## 2018-04-25 DIAGNOSIS — E785 Hyperlipidemia, unspecified: Secondary | ICD-10-CM

## 2018-04-25 DIAGNOSIS — M79671 Pain in right foot: Secondary | ICD-10-CM | POA: Diagnosis not present

## 2018-04-25 DIAGNOSIS — M7731 Calcaneal spur, right foot: Secondary | ICD-10-CM | POA: Diagnosis not present

## 2018-04-26 ENCOUNTER — Telehealth: Payer: Self-pay | Admitting: Family Medicine

## 2018-04-26 ENCOUNTER — Encounter: Payer: Self-pay | Admitting: Family Medicine

## 2018-04-26 DIAGNOSIS — M7731 Calcaneal spur, right foot: Secondary | ICD-10-CM

## 2018-04-26 HISTORY — DX: Calcaneal spur, right foot: M77.31

## 2018-04-26 NOTE — Telephone Encounter (Signed)
Please inform patient the following information: His xray did show evidence of a spur in the location of his pain. I have referred him to a podiatrist (foot doctor) for further management of his foot.  He still needs to follow here as scheduled for his appt next week for f/u HTN and his back.

## 2018-04-26 NOTE — Telephone Encounter (Signed)
Left detailed message on patients cell phone, okay per DPR, in regards to xray results and referral and appt next week with Dr Raoul Pitch.

## 2018-05-04 ENCOUNTER — Ambulatory Visit: Payer: BLUE CROSS/BLUE SHIELD | Admitting: Family Medicine

## 2018-05-08 ENCOUNTER — Encounter: Payer: Self-pay | Admitting: Sports Medicine

## 2018-05-08 ENCOUNTER — Ambulatory Visit: Payer: BLUE CROSS/BLUE SHIELD | Admitting: Sports Medicine

## 2018-05-08 ENCOUNTER — Ambulatory Visit (INDEPENDENT_AMBULATORY_CARE_PROVIDER_SITE_OTHER): Payer: BLUE CROSS/BLUE SHIELD

## 2018-05-08 VITALS — BP 180/117 | HR 93 | Resp 16

## 2018-05-08 DIAGNOSIS — M722 Plantar fascial fibromatosis: Secondary | ICD-10-CM

## 2018-05-08 DIAGNOSIS — M79671 Pain in right foot: Secondary | ICD-10-CM

## 2018-05-08 MED ORDER — TRIAMCINOLONE ACETONIDE 10 MG/ML IJ SUSP
10.0000 mg | Freq: Once | INTRAMUSCULAR | Status: AC
  Administered 2018-05-08: 10 mg

## 2018-05-08 NOTE — Patient Instructions (Signed)

## 2018-05-08 NOTE — Progress Notes (Signed)
Subjective: Ryan Fisher is a 48 y.o. male patient presents to office with complaint of moderate heel pain on the right. Patient admits to post static dyskinesia for 3-58months in duration that has slowly gotten worse aching and burning to area. Patient has treated this problem with OTC insoles and Motrin with no relief. Denies any other pedal complaints.   Review of Systems  Musculoskeletal: Positive for myalgias.  All other systems reviewed and are negative.    Patient Active Problem List   Diagnosis Date Noted  . Morbid obesity ([place]) 04/26/2018  . Heel spur, right 04/26/2018  . Hyperlipidemia LDL goal <130 09/13/2016  . Essential hypertension 08/30/2016  . Tobacco use disorder 08/30/2016    Current Outpatient Medications on File Prior to Visit  Medication Sig Dispense Refill  . atorvastatin (LIPITOR) 40 MG tablet Take 1 tablet (40 mg total) by mouth daily. (Patient not taking: Reported on 04/24/2018) 90 tablet 3  . cyclobenzaprine (FLEXERIL) 10 MG tablet Take 1 tablet (10 mg total) by mouth 2 (two) times daily as needed for muscle spasms. 30 tablet 0  . lisinopril (PRINIVIL,ZESTRIL) 20 MG tablet Take 1 tablet (20 mg total) by mouth daily. 30 tablet 0  . traMADol (ULTRAM) 50 MG tablet Take 2 tablets (100 mg total) by mouth every 8 (eight) hours as needed. 30 tablet 0   No current facility-administered medications on file prior to visit.     Allergies  Allergen Reactions  . Morphine And Related Other (See Comments)    Morphine only- sweating, flushing.     Objective: Physical Exam General: The patient is alert and oriented x3 in no acute distress.  Dermatology: Skin is warm, dry and supple bilateral lower extremities. Nails 1-10 are normal. There is no erythema, edema, no eccymosis, no open lesions present. Integument is otherwise unremarkable.  Vascular: Dorsalis Pedis pulse and Posterior Tibial pulse are 2/4 bilateral. Capillary fill time is immediate to all  digits.  Neurological: Grossly intact to light touch with an achilles reflex of +2/5 and a  negative Tinel's sign bilateral.  Musculoskeletal: Tenderness to palpation at the medial calcaneal tubercale and through the insertion of the plantar fascia on the right foot. No pain with compression of calcaneus bilateral. No pain with tuning fork to calcaneus bilateral. No pain with calf compression bilateral. There is decreased Ankle joint range of motion bilateral. All other joints range of motion within normal limits bilateral. Strength 5/5 in all groups bilateral.   Gait: Unassisted, Antalgic avoid weight on right heel  Xray, Right foot:  Normal osseous mineralization. Joint spaces preserved. No fracture/dislocation/boney destruction. Calcaneal spur present with mild thickening of plantar fascia. No other soft tissue abnormalities or radiopaque foreign bodies.   Assessment and Plan: Problem List Items Addressed This Visit    None    Visit Diagnoses    Plantar fasciitis, right    -  Primary   Relevant Medications   triamcinolone acetonide (KENALOG) 10 MG/ML injection 10 mg (Completed) (Start on 05/08/2018  3:15 PM)   Other Relevant Orders   DG Foot Complete Right (Completed)   Pain of right heel          -Complete examination performed.  -Xrays reviewed -Discussed with patient in detail the condition of plantar fasciitis, how this occurs and general treatment options. Explained both conservative and surgical treatments.  -After oral consent and aseptic prep, injected a mixture containing 1 ml of 2%  plain lidocaine, 1 ml 0.5% plain marcaine, 0.5 ml of kenalog  10 and 0.5 ml of dexamethasone phosphate into right heel. Post-injection care discussed with patient.  -Continue with Motrin PRN  -Recommended good supportive shoes and advised use of OTC insert. Explained to patient that if these orthoses work well, we will continue with these. If these do not improve his condition and  pain, we will  consider custom molded orthoses. - Explained in detail the use of the fascial brace for right which was dispensed at today's visit. -Explained and dispensed to patient daily stretching exercises. -Recommend patient to ice affected area 1-2x daily. -Patient to return to office in 4 weeks for follow up or sooner if problems or questions arise.  Landis Martins, DPM

## 2018-05-15 ENCOUNTER — Ambulatory Visit: Payer: BLUE CROSS/BLUE SHIELD | Admitting: Family Medicine

## 2018-05-15 ENCOUNTER — Encounter: Payer: Self-pay | Admitting: Family Medicine

## 2018-05-15 VITALS — BP 146/102 | HR 75 | Temp 98.1°F | Resp 18 | Ht 75.0 in | Wt 316.4 lb

## 2018-05-15 DIAGNOSIS — I10 Essential (primary) hypertension: Secondary | ICD-10-CM

## 2018-05-15 DIAGNOSIS — E785 Hyperlipidemia, unspecified: Secondary | ICD-10-CM | POA: Diagnosis not present

## 2018-05-15 MED ORDER — ATORVASTATIN CALCIUM 40 MG PO TABS: 40.0000 mg | ORAL_TABLET | Freq: Every day | ORAL | 3 refills | Status: DC

## 2018-05-15 MED ORDER — LISINOPRIL 40 MG PO TABS: 40.0000 mg | ORAL_TABLET | Freq: Every day | ORAL | 1 refills | Status: DC

## 2018-05-15 NOTE — Patient Instructions (Addendum)
Increasing lisinopril to 40 mg  A day. Take 2 of the 20 mg tabs you currently have- once finished new bottle will be 40 mg a tab --> only take 1.  I refilled your cholesterol med.  F/U 2 weeks nurse BP check.   Follow with me 3 months.  Low sodium diet will help.   Please help [place] help you:  We are honored you have chosen [facility] for your Primary Care home. Below you will find basic instructions that you may need to access in the future. Please help [place] help you by reading the instructions, which cover many of the frequent questions we experience.   Prescription refills and request:  -In order to allow more efficient response time, please call your pharmacy for all refills. They will forward the request electronically to [place]. This allows for the quickest possible response. Request left on a nurse line can take longer to refill, since these are checked as time allows between office patients and other phone calls.  - refill request can take up to 3-5 working days to complete.  - If request is sent electronically and request is appropiate, it is usually completed in 1-2 business days.  - all patients will need to be seen routinely for all chronic medical conditions requiring prescription medications (see follow-up below). If you are overdue for follow up on your condition, you will be asked to make an appointment and we will call in enough medication to cover you until your appointment (up to 30 days).  - all controlled substances will require a face to face visit to request/refill.  - if you desire your prescriptions to go through a new pharmacy, and have an active script at original pharmacy, you will need to call your pharmacy and have scripts transferred to new pharmacy. This is completed between the pharmacy locations and not by your provider.    Results: If any images or labs were ordered, it can take up to 1 week to get results depending on the test ordered and the lab/facility running  and resulting the test. - Normal or stable results, which do not need further discussion, may be released to your mychart immediately with attached note to you. A call may not be generated for normal results. Please make certain to sign up for mychart. If you have questions on how to activate your mychart you can call the front office.  - If your results need further discussion, our office will attempt to contact you via phone, and if unable to reach you after 2 attempts, we will release your abnormal result to your mychart with instructions.  - All results will be automatically released in mychart after 1 week.  - Your provider will provide you with explanation and instruction on all relevant material in your results. Please keep in mind, results and labs may appear confusing or abnormal to the untrained eye, but it does not mean they are actually abnormal for you personally. If you have any questions about your results that are not covered, or you desire more detailed explanation than what was provided, you should make an appointment with your provider to do so.   Our office handles many outgoing and incoming calls daily. If we have not contacted you within 1 week about your results, please check your mychart to see if there is a message first and if not, then contact our office.  In helping with this matter, you help decrease call volume, and therefore allow  [place] to be able to respond to patients needs more efficiently.   Acute office visits (sick visit):  An acute visit is intended for a new problem and are scheduled in shorter time slots to allow schedule openings for patients with new problems. This is the appropriate visit to discuss a new problem. Problems will not be addressed by phone call or Echart message. Appointment is needed if requesting treatment. In order to provide you with excellent quality medical care with proper time for you to explain your problem, have an exam and receive treatment  with instructions, these appointments should be limited to one new problem per visit. If you experience a new problem, in which you desire to be addressed, please make an acute office visit, we save openings on the schedule to accommodate you. Please do not save your new problem for any other type of visit, let us take care of it properly and quickly for you.   Follow up visits:  Depending on your condition(s) your provider will need to see you routinely in order to provide you with quality care and prescribe medication(s). Most chronic conditions (Example: hypertension, Diabetes, depression/anxiety... etc), require visits a couple times a year. Your provider will instruct you on proper follow up for your personal medical conditions and history. Please make certain to make follow up appointments for your condition as instructed. Failing to do so could result in lapse in your medication treatment/refills. If you request a refill, and are overdue to be seen on a condition, we will always provide you with a 30 day script (once) to allow you time to schedule.    Medicare wellness (well visit): - we have a wonderful Nurse Maudie Mercury), that will meet with you and provide you will yearly medicare wellness visits. These visits should occur yearly (can not be scheduled less than 1 calendar year apart) and cover preventive health, immunizations, advance directives and screenings you are entitled to yearly through your medicare benefits. Do not miss out on your entitled benefits, this is when medicare will pay for these benefits to be ordered for you.  These are strongly encouraged by your provider and is the appropriate type of visit to make certain you are up to date with all preventive health benefits. If you have not had your medicare wellness exam in the last 12 months, please make certain to schedule one by calling the office and schedule your medicare wellness with Maudie Mercury as soon as possible.   Yearly physical (well  visit):  - Adults are recommended to be seen yearly for physicals. Check with your insurance and date of your last physical, most insurances require one calendar year between physicals. Physicals include all preventive health topics, screenings, medical exam and labs that are appropriate for gender/age and history. You may have fasting labs needed at this visit. This is a well visit (not a sick visit), new problems should not be covered during this visit (see acute visit).  - Pediatric patients are seen more frequently when they are younger. Your provider will advise you on well child visit timing that is appropriate for your their age. - This is not a medicare wellness visit. Medicare wellness exams do not have an exam portion to the visit. Some medicare companies allow for a physical, some do not allow a yearly physical. If your medicare allows a yearly physical you can schedule the medicare wellness with our nurse Maudie Mercury and have your physical with your provider after, on the same day. Please  check with insurance for your full benefits.   Late Policy/No Shows:  - all new patients should arrive 15-30 minutes earlier than appointment to allow [place] time  to  obtain all personal demographics,  insurance information and for you to complete office paperwork. - All established patients should arrive 10-15 minutes earlier than appointment time to update all information and be checked in .  - In our best efforts to run on time, if you are late for your appointment you will be asked to either reschedule or if able, we will work you back into the schedule. There will be a wait time to work you back in the schedule,  depending on availability.  - If you are unable to make it to your appointment as scheduled, please call 24 hours ahead of time to allow [place] to fill the time slot with someone else who needs to be seen. If you do not cancel your appointment ahead of time, you may be charged a no show fee.

## 2018-05-15 NOTE — Progress Notes (Signed)
Ryan Fisher , 1970/03/18, 48 y.o., male MRN: [id number] Patient Care Team    Relationship Specialty Notifications Start End  Ma Hillock, DO PCP - General Family Medicine  08/30/16   Elsie Stain, MD  Pulmonary Disease Abnormal results only, Admissions 11/09/10     Chief Complaint  Patient presents with  . Hypertension    05/08/2018 pt recieved steroid shot at foot MD, states it was elevated at that office but he has been in a lot of pain. Has not been checking BP's at home and is taking medication as directed      Subjective: Pt presents for an OV for follow up on back pain and hypertension Hypertension/HLD/Obesity:  Pt reports compliance with lisinopril 20 mg daily. Blood pressures ranges at home are not checked. Patient denies chest pain, shortness of breath or lower extremity edema. Pt does not take a daily baby ASA. Pt was prescribed statin-  stopped medication.  She has not been seen since July 2018 for his blood pressure.  He has been out of medications for at least a year. BMP: 08/30/2016 unremarkable. CBC: 08/30/2016 unremarkable Lipid panel:  09/06/2016 LDL 138, started on statin secondary to CV risk.  Diet: low sodium Exercise: not routinely RF: HTN, obesity, Fhx HD.    Lumbar pain: Pain has almost completely resolved. He is back to his normal activities. Prior note:  New problem.  Patient reports he has had left-sided low back pain without radiation for about 3 weeks.  He reports he twisted his back when he was unloading some lumbar and it has bothered him since.  He has tried heat and Advil with minimal relief.  He denies any radiation pain to his lower extremity.  He denies any prior history of arthritis or surgeries on his lower back.  He recalls pulling a muscle in the same location around 2008.  But he has no problems since.  Depression screen [facility] 2/9 09/06/2016 08/30/2016  Decreased Interest 0 0  Down, Depressed, Hopeless 0 0  PHQ - 2 Score 0 0    Allergies    Allergen Reactions  . Morphine And Related Other (See Comments)    Morphine only- sweating, flushing.    Social History   Social History Narrative   Married to [employer].    Some college attendance. 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    Drinks caffeine.    Wears a seatbelt and bicycle helmet.    Smoke detector in the home. Firearms locked in the home.    Feels safe in his relationships.    Past Medical History:  Diagnosis Date  . Hyperlipidemia   . Hypertension    Past Surgical History:  Procedure Laterality Date  . ROTATOR CUFF REPAIR Left 2012   left   Family History  Problem Relation Age of Onset  . Allergies Brother   . Asthma Brother   . Heart failure Paternal Grandfather   . Brain cancer Maternal Aunt   . Arthritis Mother   . Early death Paternal Uncle    Allergies as of 05/15/2018      Reactions   Morphine And Related Other (See Comments)   Morphine only- sweating, flushing.       Medication List       Accurate as of May 15, 2018  3:04 PM. Always use your most recent med list.        atorvastatin 40 MG tablet Commonly known as:  LIPITOR Take 1 tablet (40  mg total) by mouth daily.   cyclobenzaprine 10 MG tablet Commonly known as:  FLEXERIL Take 1 tablet (10 mg total) by mouth 2 (two) times daily as needed for muscle spasms.   lisinopril 20 MG tablet Commonly known as:  PRINIVIL,ZESTRIL Take 1 tablet (20 mg total) by mouth daily.   traMADol 50 MG tablet Commonly known as:  ULTRAM Take 2 tablets (100 mg total) by mouth every 8 (eight) hours as needed.       All past medical history, surgical history, allergies, family history, immunizations andmedications were updated in the EMR today and reviewed under the history and medication portions of their EMR.     ROS: Negative, with the exception of above mentioned in HPI   Objective:  BP (!) 146/102 (BP Location: Left Arm, Patient Position: Sitting, Cuff Size: Large)   Pulse 75   Temp 98.1 F  (36.7 C) (Oral)   Resp 18   Ht 6\' 3"  (1.905 m)   Wt (!) 316 lb 6 oz (143.5 kg)   SpO2 97%   BMI 39.54 kg/m  Body mass index is 39.54 kg/m. Gen: Afebrile. No acute distress. Nontoxic in appearance. Obese.  HENT: AT. [place].  MMM.  Eyes:Pupils Equal Round Reactive to light, Extraocular movements intact,  Conjunctiva without redness, discharge or icterus. Neck/lymp/endocrine: Supple,no lymphadenopathy, no thyromegaly CV: RRR no murmur, no edema, +2/4 P posterior tibialis pulses Chest: CTAB, no wheeze or crackles Abd: Soft. NTND. BS present. no Masses palpated.  Neuro:  Normal gait. PERLA. EOMi. Alert. Oriented x3  No exam data present No results found. No results found for this or any previous visit (from the past 24 hour(s)).  Assessment/Plan: Ryan Fisher is a 48 y.o. male present for OV for  Lumbar pain - mostly resolved. Doing well.   Hyperlipidemia LDL goal <130/Essential hypertension/Morbid obesity ([facility]) -Blood pressure not at goal, but improved. Increase lisinopril to 40 mg QD.   -He was counseled on low-sodium diet, routine exercise and routine follow-up. -restart of atorvastatin- refills provided.  - labs next visit.  -Follow-up  2 weeks nurse BP recheck and in 3 months with provider with fasting labs.    Reviewed expectations re: course of current medical issues.  Discussed self-management of symptoms.  Outlined signs and symptoms indicating need for more acute intervention.  Patient verbalized understanding and all questions were answered.  Patient received an After-Visit Summary.    No orders of the defined types were placed in this encounter.   Note is dictated utilizing voice recognition software. Although note has been proof read prior to signing, occasional typographical errors still can be missed. If any questions arise, please do not hesitate to call for verification.   electronically signed by:  Howard Pouch, DO  [place]

## 2018-05-29 ENCOUNTER — Other Ambulatory Visit: Payer: Self-pay

## 2018-05-29 ENCOUNTER — Ambulatory Visit (INDEPENDENT_AMBULATORY_CARE_PROVIDER_SITE_OTHER): Payer: BLUE CROSS/BLUE SHIELD | Admitting: *Deleted

## 2018-05-29 VITALS — BP 146/98

## 2018-05-29 DIAGNOSIS — I10 Essential (primary) hypertension: Secondary | ICD-10-CM

## 2018-05-29 MED ORDER — AMLODIPINE BESYLATE 5 MG PO TABS: 5.0000 mg | ORAL_TABLET | Freq: Every day | ORAL | 1 refills | Status: DC

## 2018-05-29 NOTE — Addendum Note (Signed)
Addended by: Howard Pouch A on: 05/29/2018 04:25 PM   Modules accepted: Orders

## 2018-05-29 NOTE — Progress Notes (Addendum)
Alexsandro Salek is a 48 y.o. male presents to the office today for Blood pressure recheck secondary to regimen change (Lisinopril was increased to 40mg  daily).  Blood pressure medication: Lisinopril 40mg  daily If on medication, Last dose was at least 1-2 hours prior to recheck: Yes (pt took medication last night before going to bed) Blood pressure was taken in the left arm after patient rested for 5 minutes.   BP (!) 146/98 (BP Location: Left Arm, Patient Position: Sitting, Cuff Size: Large)    Sheilia Reznick  _________________________________________________________ Please call pt Continue lisinopril 40 mg QD> can continue to take at night Start amlodipine 5 mg in the morning.  F/u 6 mos  Electronically Signed by: Howard Pouch, DO [facility]- OR

## 2018-05-29 NOTE — Progress Notes (Signed)
Left message for pt to call back  °

## 2018-05-31 NOTE — Progress Notes (Addendum)
Date: 05/31/18 Time: 10:04am  Left message for pt to call back.

## 2018-06-05 ENCOUNTER — Ambulatory Visit: Payer: BLUE CROSS/BLUE SHIELD | Admitting: Sports Medicine

## 2018-07-24 ENCOUNTER — Encounter: Payer: Self-pay | Admitting: Sports Medicine

## 2018-07-24 ENCOUNTER — Other Ambulatory Visit: Payer: Self-pay

## 2018-07-24 ENCOUNTER — Ambulatory Visit: Payer: BLUE CROSS/BLUE SHIELD | Admitting: Sports Medicine

## 2018-07-24 VITALS — Temp 98.1°F

## 2018-07-24 DIAGNOSIS — M722 Plantar fascial fibromatosis: Secondary | ICD-10-CM | POA: Diagnosis not present

## 2018-07-24 DIAGNOSIS — M79671 Pain in right foot: Secondary | ICD-10-CM

## 2018-07-24 MED ORDER — TRIAMCINOLONE ACETONIDE 40 MG/ML IJ SUSP
20.0000 mg | Freq: Once | INTRAMUSCULAR | Status: AC
  Administered 2018-07-24: 20 mg

## 2018-07-24 NOTE — Progress Notes (Signed)
Subjective: Ryan Fisher is a 48 y.o. male returns to office for follow up evaluation after Right heel injection for plantar fasciitis, injection #1 administered 3-4 weeks ago. Patient states that the injection seems to help his pain; pain is now 6/10 and has decreased in frequency to the area. Stretching and brace helps. Patient denies any recent changes in medications or new problems since last visit.   Patient Active Problem List   Diagnosis Date Noted  . Morbid obesity ([place]) 04/26/2018  . Heel spur, right 04/26/2018  . Hyperlipidemia LDL goal <130 09/13/2016  . Essential hypertension 08/30/2016  . Tobacco use disorder 08/30/2016    Current Outpatient Medications on File Prior to Visit  Medication Sig Dispense Refill  . amLODipine (NORVASC) 5 MG tablet Take 1 tablet (5 mg total) by mouth daily. 90 tablet 1  . atorvastatin (LIPITOR) 40 MG tablet Take 1 tablet (40 mg total) by mouth daily. 90 tablet 3  . cyclobenzaprine (FLEXERIL) 10 MG tablet Take 1 tablet (10 mg total) by mouth 2 (two) times daily as needed for muscle spasms. 30 tablet 0  . lisinopril (PRINIVIL,ZESTRIL) 40 MG tablet Take 1 tablet (40 mg total) by mouth daily. 90 tablet 1  . traMADol (ULTRAM) 50 MG tablet Take 2 tablets (100 mg total) by mouth every 8 (eight) hours as needed. 30 tablet 0   No current facility-administered medications on file prior to visit.     Allergies  Allergen Reactions  . Morphine And Related Other (See Comments)    Morphine only- sweating, flushing.     Objective:   General:  Alert and oriented x 3, in no acute distress  Dermatology: Skin is warm, dry, and supple bilateral. Nails are within normal limits. There is no lower extremity erythema, no eccymosis, no open lesions present bilateral.   Vascular: Dorsalis Pedis and Posterior Tibial pedal pulses are 2/4 bilateral. + hair growth noted bilateral. Capillary Fill Time is 3 seconds in all digits. No varicosities, No edema bilateral lower  extremities.   Neurological: Sensation grossly intact to light touch with an achilles reflex of +2 and a negative Tinel's sign bilateral. Vibratory, sharp/dull, Semmes Weinstein Monofilament within normal limits.   Musculoskeletal: There is decreased tenderness to palpation at the medial calcaneal tubercale and through the insertion of the plantar fascia on the right foot. No pain with compression to calcaneus or application of tuning fork. There is decreased Ankle joint range of motion bilateral. All other jointsrange of motion  within normal limits bilateral. Strength 5/5 bilateral.   Assessment and Plan: Problem List Items Addressed This Visit    None    Visit Diagnoses    Plantar fasciitis, right    -  Primary   Pain of right heel         -Complete examination performed.  -Previous x-rays reviewed. -Discussed with patient in detail the condition of plantar fasciitis, how this  occurs related to the foot type of the patient and general treatment options. - Patient opted for another injection today; After oral consent and aseptic prep, injected a mixture containing 1 ml of 1%plain lidocaine, 1 ml 0.5% plain marcaine, 0.5 ml of kenalog 40 and 0.5 ml of dexmethasone phosphate to right heel at area of most pain/trigger point injection. -Continue with fascial brace (replacement brace given at today's visit), stretching, icing, good supportive shoes daily. -Discussed long term care and reocurrence; will closely monitor; if fails to improve will consider other treatment modalities.  -Patient to return to  office for orthotics when called or sooner if problems or questions arise.  Landis Martins, DPM

## 2018-08-01 ENCOUNTER — Other Ambulatory Visit: Payer: Self-pay

## 2018-08-01 ENCOUNTER — Ambulatory Visit (INDEPENDENT_AMBULATORY_CARE_PROVIDER_SITE_OTHER): Payer: BLUE CROSS/BLUE SHIELD | Admitting: Orthotics

## 2018-08-01 DIAGNOSIS — M7731 Calcaneal spur, right foot: Secondary | ICD-10-CM

## 2018-08-01 DIAGNOSIS — M722 Plantar fascial fibromatosis: Secondary | ICD-10-CM

## 2018-08-01 DIAGNOSIS — M79671 Pain in right foot: Secondary | ICD-10-CM

## 2018-08-01 NOTE — Progress Notes (Signed)

## 2018-08-14 ENCOUNTER — Ambulatory Visit: Payer: BLUE CROSS/BLUE SHIELD | Admitting: Family Medicine

## 2018-08-27 ENCOUNTER — Other Ambulatory Visit: Payer: Self-pay

## 2018-08-27 ENCOUNTER — Ambulatory Visit (INDEPENDENT_AMBULATORY_CARE_PROVIDER_SITE_OTHER): Payer: BC Managed Care – PPO | Admitting: Orthotics

## 2018-08-27 DIAGNOSIS — M7731 Calcaneal spur, right foot: Secondary | ICD-10-CM

## 2018-08-27 DIAGNOSIS — M79671 Pain in right foot: Secondary | ICD-10-CM

## 2018-08-27 NOTE — Progress Notes (Signed)
Patient came in today to p/up functional foot orthotics.   The orthotics were assessed to both fit and function.  The F/O addressed the biomechanical issues/pathologies as intended, offering good longitudinal arch support, proper offloading, and foot support. There weren't any signs of discomfort or irritation.  The F/O fit properly in footwear with minimal trimming/adjustments. 

## 2018-11-26 ENCOUNTER — Telehealth: Payer: Self-pay

## 2018-11-26 ENCOUNTER — Other Ambulatory Visit: Payer: Self-pay

## 2018-11-26 MED ORDER — LISINOPRIL 40 MG PO TABS: 40.0000 mg | ORAL_TABLET | Freq: Every day | ORAL | 1 refills | Status: DC

## 2018-11-26 NOTE — Telephone Encounter (Signed)
Patient called back and scheduled a follow up for this week. Sent in refill according to refill protocol.

## 2018-11-26 NOTE — Telephone Encounter (Signed)
Call patient and lvm for a refill request that was faxed to [place] from [facility] in [place] for his lisinopril. Patient is due for a [place] as well. Please schedule appointment so we can send in his medication.

## 2018-11-29 ENCOUNTER — Encounter: Payer: Self-pay | Admitting: Family Medicine

## 2018-11-29 ENCOUNTER — Other Ambulatory Visit: Payer: Self-pay

## 2018-11-29 ENCOUNTER — Ambulatory Visit (INDEPENDENT_AMBULATORY_CARE_PROVIDER_SITE_OTHER): Payer: BLUE CROSS/BLUE SHIELD | Admitting: Family Medicine

## 2018-11-29 VITALS — BP 138/80 | HR 71 | Temp 97.9°F | Resp 18 | Ht 75.0 in | Wt 304.0 lb

## 2018-11-29 DIAGNOSIS — E785 Hyperlipidemia, unspecified: Secondary | ICD-10-CM

## 2018-11-29 DIAGNOSIS — I10 Essential (primary) hypertension: Secondary | ICD-10-CM

## 2018-11-29 DIAGNOSIS — R39198 Other difficulties with micturition: Secondary | ICD-10-CM

## 2018-11-29 DIAGNOSIS — Z23 Encounter for immunization: Secondary | ICD-10-CM

## 2018-11-29 LAB — CBC
HCT: 45.4 % (ref 39.0–52.0)
Hemoglobin: 15.2 g/dL (ref 13.0–17.0)
MCHC: 33.5 g/dL (ref 30.0–36.0)
MCV: 91.4 fl (ref 78.0–100.0)
Platelets: 267 10*3/uL (ref 150.0–400.0)
RBC: 4.97 Mil/uL (ref 4.22–5.81)
RDW: 13.8 % (ref 11.5–15.5)
WBC: 14 10*3/uL — ABNORMAL HIGH (ref 4.0–10.5)

## 2018-11-29 LAB — COMPREHENSIVE METABOLIC PANEL
ALT: 28 U/L (ref 0–53)
AST: 26 U/L (ref 0–37)
Albumin: 4.5 g/dL (ref 3.5–5.2)
Alkaline Phosphatase: 91 U/L (ref 39–117)
BUN: 14 mg/dL (ref 6–23)
CO2: 29 mEq/L (ref 19–32)
Calcium: 10 mg/dL (ref 8.4–10.5)
Chloride: 105 mEq/L (ref 96–112)
Creatinine, Ser: 1.24 mg/dL (ref 0.40–1.50)
GFR: 62.33 mL/min (ref >60.00)
Glucose, Bld: 98 mg/dL (ref 70–99)
Potassium: 4.4 mEq/L (ref 3.5–5.1)
Sodium: 141 mEq/L (ref 135–145)
Total Bilirubin: 0.7 mg/dL (ref 0.2–1.2)
Total Protein: 7.3 g/dL (ref 6.0–8.3)

## 2018-11-29 LAB — MICROALBUMIN / CREATININE URINE RATIO
Creatinine,U: 326.4 mg/dL
Microalb Creat Ratio: 1.1 mg/g (ref 0.0–30.0)
Microalb, Ur: 3.7 mg/dL — ABNORMAL HIGH (ref 0.0–1.9)

## 2018-11-29 LAB — LIPID PANEL
Cholesterol: 114 mg/dL (ref 0–200)
HDL: 33.8 mg/dL — ABNORMAL LOW (ref >39.00)
LDL Cholesterol: 62 mg/dL (ref 0–99)
NonHDL: 80.4
Total CHOL/HDL Ratio: 3
Triglycerides: 93 mg/dL (ref 0.0–149.0)
VLDL: 18.6 mg/dL (ref 0.0–40.0)

## 2018-11-29 LAB — PSA: PSA: 0.94 ng/mL (ref 0.10–4.00)

## 2018-11-29 LAB — TSH: TSH: 1.85 u[IU]/mL (ref 0.35–4.50)

## 2018-11-29 MED ORDER — AMLODIPINE BESYLATE 5 MG PO TABS: 7.5000 mg | ORAL_TABLET | Freq: Every day | ORAL | 1 refills | Status: DC

## 2018-11-29 NOTE — Patient Instructions (Addendum)
Increase amlodipine to 1.5 tabs a day.  Lisinopril dose the same.  Both medications refilled today.  We will call you Monday with your lab results.   Follow up in 6 months, unless labs indicate need for closer appt.     Please help [place] help you:  We are honored you have chosen [facility] for your Primary [facility] home. Below you will find basic instructions that you may need to access in the future. Please help [place] help you by reading the instructions, which cover many of the frequent questions we experience.   Prescription refills and request:  -In order to allow more efficient response time, please call your pharmacy for all refills. They will forward the request electronically to [place]. This allows for the quickest possible response. Request left on a nurse line can take longer to refill, since these are checked as time allows between office patients and other phone calls.  - refill request can take up to 3-5 working days to complete.  - If request is sent electronically and request is appropiate, it is usually completed in 1-2 business days.  - all patients will need to be seen routinely for all chronic medical conditions requiring prescription medications (see follow-up below). If you are overdue for follow up on your condition, you will be asked to make an appointment and we will call in enough medication to cover you until your appointment (up to 30 days).  - all controlled substances will require a face to face visit to request/refill.  - if you desire your prescriptions to go through a new pharmacy, and have an active script at original pharmacy, you will need to call your pharmacy and have scripts transferred to new pharmacy. This is completed between the pharmacy locations and not by your provider.    Results: If any images or labs were ordered, it can take up to 1 week to get results depending on the test ordered and the lab/facility running and resulting the test. - Normal or stable  results, which do not need further discussion, may be released to your mychart immediately with attached note to you. A call may not be generated for normal results. Please make certain to sign up for mychart. If you have questions on how to activate your mychart you can call the front office.  - If your results need further discussion, our office will attempt to contact you via phone, and if unable to reach you after 2 attempts, we will release your abnormal result to your mychart with instructions.  - All results will be automatically released in mychart after 1 week.  - Your provider will provide you with explanation and instruction on all relevant material in your results. Please keep in mind, results and labs may appear confusing or abnormal to the untrained eye, but it does not mean they are actually abnormal for you personally. If you have any questions about your results that are not covered, or you desire more detailed explanation than what was provided, you should make an appointment with your provider to do so.   Our office handles many outgoing and incoming calls daily. If we have not contacted you within 1 week about your results, please check your mychart to see if there is a message first and if not, then contact our office.  In helping with this matter, you help decrease call volume, and therefore allow [place] to be able to respond to patients needs more efficiently.   Acute office  visits (sick visit):  An acute visit is intended for a new problem and are scheduled in shorter time slots to allow schedule openings for patients with new problems. This is the appropriate visit to discuss a new problem. Problems will not be addressed by phone call or Echart message. Appointment is needed if requesting treatment. In order to provide you with excellent quality medical care with proper time for you to explain your problem, have an exam and receive treatment with instructions, these appointments should  be limited to one new problem per visit. If you experience a new problem, in which you desire to be addressed, please make an acute office visit, we save openings on the schedule to accommodate you. Please do not save your new problem for any other type of visit, let us take care of it properly and quickly for you.   Follow up visits:  Depending on your condition(s) your provider will need to see you routinely in order to provide you with quality care and prescribe medication(s). Most chronic conditions (Example: hypertension, Diabetes, depression/anxiety... etc), require visits a couple times a year. Your provider will instruct you on proper follow up for your personal medical conditions and history. Please make certain to make follow up appointments for your condition as instructed. Failing to do so could result in lapse in your medication treatment/refills. If you request a refill, and are overdue to be seen on a condition, we will always provide you with a 30 day script (once) to allow you time to schedule.    Medicare wellness (well visit): - we have a wonderful Nurse Maudie Mercury), that will meet with you and provide you will yearly medicare wellness visits. These visits should occur yearly (can not be scheduled less than 1 calendar year apart) and cover preventive health, immunizations, advance directives and screenings you are entitled to yearly through your medicare benefits. Do not miss out on your entitled benefits, this is when medicare will pay for these benefits to be ordered for you.  These are strongly encouraged by your provider and is the appropriate type of visit to make certain you are up to date with all preventive health benefits. If you have not had your medicare wellness exam in the last 12 months, please make certain to schedule one by calling the office and schedule your medicare wellness with Maudie Mercury as soon as possible.   Yearly physical (well visit):  - Adults are recommended to be seen  yearly for physicals. Check with your insurance and date of your last physical, most insurances require one calendar year between physicals. Physicals include all preventive health topics, screenings, medical exam and labs that are appropriate for gender/age and history. You may have fasting labs needed at this visit. This is a well visit (not a sick visit), new problems should not be covered during this visit (see acute visit).  - Pediatric patients are seen more frequently when they are younger. Your provider will advise you on well child visit timing that is appropriate for your their age. - This is not a medicare wellness visit. Medicare wellness exams do not have an exam portion to the visit. Some medicare companies allow for a physical, some do not allow a yearly physical. If your medicare allows a yearly physical you can schedule the medicare wellness with our nurse Maudie Mercury and have your physical with your provider after, on the same day. Please check with insurance for your full benefits.   Late Policy/No Shows:  - all  new patients should arrive 15-30 minutes earlier than appointment to allow [place] time  to  obtain all personal demographics,  insurance information and for you to complete office paperwork. - All established patients should arrive 10-15 minutes earlier than appointment time to update all information and be checked in .  - In our best efforts to run on time, if you are late for your appointment you will be asked to either reschedule or if able, we will work you back into the schedule. There will be a wait time to work you back in the schedule,  depending on availability.  - If you are unable to make it to your appointment as scheduled, please call 24 hours ahead of time to allow [place] to fill the time slot with someone else who needs to be seen. If you do not cancel your appointment ahead of time, you may be charged a no show fee.

## 2018-11-29 NOTE — Progress Notes (Signed)
Ryan Fisher , Feb 13, 1971, 48 y.o., male MRN: [id number] Patient Care Team    Relationship Specialty Notifications Start End  Ryan Hillock, DO PCP - General Family Medicine  08/30/16   Ryan Stain, MD  Pulmonary Disease Abnormal results only, Admissions 11/09/10     Chief Complaint  Patient presents with  . Hypertension    Pt checks BP at home and he states it has been okay. 130-135/85-90. Takes medication before bed.      Subjective: Pt presents for an OV for follow up on back pain and hypertension Hypertension/HLD/Obesity:  Pt reports compliance with lisinopril 40 mg daily and amlodipine 5 mg daily. Blood pressures ranges at home are 130-135/80 5-90. Patient denies chest pain, shortness of breath, dizziness or lower extremity edema. Pt does not take a daily baby ASA. Pt is now taking his statin daily.  BMP: 08/30/2016 unremarkable. CBC: 08/30/2016 unremarkable Lipid panel:  09/06/2016 LDL 138, started on statin secondary to CV risk.  TSH: 08/30/2016 within normal limits Diet: low sodium Exercise: not routinely RF: HTN, obesity, Fhx HD.  Daily smoker.   Urinary changes: Patient reports he has had urinary changes over the last few months.  He states his urinary stream has slowed.  He is having more difficulty initiating his urinary stream.  He does not feel he is producing as much urine with each urination.  He denies any burning with urination.  He denies any lower back or abdominal pain.  He is a smoker.  He has no family history of prostate cancer.   Depression screen Ryan Fisher 2/9 09/06/2016 08/30/2016  Decreased Interest 0 0  Down, Depressed, Hopeless 0 0  PHQ - 2 Score 0 0    Allergies  Allergen Reactions  . Morphine And Related Other (See Comments)    Morphine only- sweating, Ryan.    Social History   Social History Narrative   Married to [employer].    Some college attendance. 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    Drinks caffeine.    Wears a seatbelt and bicycle  helmet.    Smoke detector in the home. Firearms locked in the home.    Feels safe in his relationships.    Past Medical History:  Diagnosis Date  . Hyperlipidemia   . Hypertension    Past Surgical History:  Procedure Laterality Date  . ROTATOR CUFF REPAIR Left 2012   left   Family History  Problem Relation Age of Onset  . Allergies Brother   . Asthma Brother   . Heart failure Paternal Grandfather   . Brain cancer Maternal Aunt   . Arthritis Mother   . Early death Paternal Uncle    Allergies as of 11/29/2018      Reactions   Morphine And Related Other (See Comments)   Morphine only- sweating, Ryan.       Medication List       Accurate as of November 29, 2018 11:23 AM. If you have any questions, ask your nurse or doctor.        STOP taking these medications   cyclobenzaprine 10 MG tablet Commonly known as: FLEXERIL Stopped by: Ryan Pouch, DO   traMADol 50 MG tablet Commonly known as: ULTRAM Stopped by: Ryan Pouch, DO     TAKE these medications   amLODipine 5 MG tablet Commonly known as: NORVASC Take 1.5 tablets (7.5 mg total) by mouth daily. What changed: how much to take Changed by: Ryan Pouch, DO  atorvastatin 40 MG tablet Commonly known as: LIPITOR Take 1 tablet (40 mg total) by mouth daily.   lisinopril 40 MG tablet Commonly known as: ZESTRIL Take 1 tablet (40 mg total) by mouth daily.       All past medical history, surgical history, allergies, family history, immunizations andmedications were updated in the EMR today and reviewed under the history and medication portions of their EMR.     ROS: Negative, with the exception of above mentioned in HPI   Objective:  BP 138/80 (BP Location: Right Arm, Patient Position: Sitting, Cuff Size: Normal)   Pulse 71   Temp 97.9 F (36.6 C) (Temporal)   Resp 18   Ht '6\' 3"'  (1.905 m)   Wt (!) 304 lb (137.9 kg)   SpO2 98%   BMI 38.00 kg/m  Body mass index is 38 kg/m. Gen: Afebrile. No  acute distress.  Obese, Caucasian male. HENT: AT. [place]. Eyes:Pupils Equal Round Reactive to light, Extraocular movements intact,  Conjunctiva without redness, discharge or icterus. Neck/lymp/endocrine: Supple, no lymphadenopathy, no thyromegaly CV: RRR no murmur, no edema, +2/4 P posterior tibialis pulses Chest: CTAB, no wheeze or crackles Abd: Soft. NTND. BS present.  Neuro:  Normal gait. Alert. Oriented x3  Psych: Normal affect, dress and demeanor. Normal speech. Normal thought content and judgment.   No exam data present No results found. No results found for this or any previous visit (from the past 24 hour(s)).  Assessment/Plan: Ryan Fisher is a 48 y.o. male present for OV for  Hyperlipidemia LDL goal <130/Essential hypertension/Morbid obesity (Ryan Fisher) - much improved BP.Still borderline to goal- increase amlodipine to 7.5 mg (1.5 tabs)- new script provided.  - Continue lisinopril to 40 mg QD.    -He was counseled on low-sodium diet, routine exercise and routine follow-up. - continue  atorvastatin- refills provided.  - Comp Met (CMET) - Lipid panel - CBC - TSH - f/u 6 mos  Need for influenza vaccination - Flu Vaccine QUAD 36+ mos IM  Urinary stream slowing - new urinary stream changes. Discussed work up with him. He is a smoker/former smoker. No fhx.  - consider start flomax after labs return.  - PSA - Urine Microalbumin w/creat. ratio   Reviewed expectations re: course of current medical issues.  Discussed self-management of symptoms.  Outlined signs and symptoms indicating need for more acute intervention.  Patient verbalized understanding and all questions were answered.  Patient received an After-Visit Summary.    Orders Placed This Encounter  Procedures  . Flu Vaccine QUAD 36+ mos IM  . Comp Met (CMET)  . Lipid panel  . CBC  . TSH  . PSA  . Urine Microalbumin w/creat. ratio    Note is dictated utilizing voice recognition software. Although note has been  proof read prior to signing, occasional typographical errors still can be missed. If any questions arise, please do not hesitate to call for verification.   electronically signed by:  Ryan Pouch, DO  [place]

## 2018-12-03 ENCOUNTER — Telehealth: Payer: Self-pay | Admitting: Family Medicine

## 2018-12-03 DIAGNOSIS — R39198 Other difficulties with micturition: Secondary | ICD-10-CM

## 2018-12-03 MED ORDER — TAMSULOSIN HCL 0.4 MG PO CAPS: 0.4000 mg | ORAL_CAPSULE | Freq: Every day | ORAL | 1 refills | Status: DC

## 2018-12-03 NOTE — Telephone Encounter (Signed)
Please inform patient the following information: His labs are stable. He had a mild elevation in his WBC count- which can be signs of infection. He did not report any symptoms at his [facility] appt. If he develops fever, chills, or illness symptoms would have him follow up.  His prostate screen was normal. Since he is endorsing changes in his urinary stream I have called in medication called Flomax for him to start one pill daily in the morning.  If symptoms not resolved after start medication follow up in 4 weeks- sooner if worsening.  Please have him schedule a nurse visit to have a UA completed as well- order placed. We would want to rule out infection since his had an elevated WBC count.  Thanks!

## 2018-12-03 NOTE — Telephone Encounter (Signed)
Pt was called and detailed message was left with results/information for patient. Pt was told to call back to schedule nurse visit

## 2019-01-04 ENCOUNTER — Other Ambulatory Visit: Payer: Self-pay

## 2019-01-04 ENCOUNTER — Ambulatory Visit: Payer: BC Managed Care – PPO

## 2019-01-04 DIAGNOSIS — R39198 Other difficulties with micturition: Secondary | ICD-10-CM

## 2019-01-05 LAB — URINALYSIS W MICROSCOPIC + REFLEX CULTURE
Bacteria, UA: NONE SEEN /HPF
Bilirubin Urine: NEGATIVE
Glucose, UA: NEGATIVE
Hgb urine dipstick: NEGATIVE
Hyaline Cast: NONE SEEN /LPF
Ketones, ur: NEGATIVE
Leukocyte Esterase: NEGATIVE
Nitrites, Initial: NEGATIVE
Protein, ur: NEGATIVE
Specific Gravity, Urine: 1.029 (ref 1.001–1.03)
Squamous Epithelial / HPF: NONE SEEN /HPF (ref <=5)
WBC, UA: NONE SEEN /HPF (ref 0–5)
pH: <=5 (ref 5.0–8.0)

## 2019-01-05 LAB — NO CULTURE INDICATED

## 2019-01-08 DIAGNOSIS — R197 Diarrhea, unspecified: Secondary | ICD-10-CM | POA: Diagnosis not present

## 2019-01-08 DIAGNOSIS — R112 Nausea with vomiting, unspecified: Secondary | ICD-10-CM | POA: Diagnosis not present

## 2019-03-27 DIAGNOSIS — Z20828 Contact with and (suspected) exposure to other viral communicable diseases: Secondary | ICD-10-CM | POA: Diagnosis not present

## 2019-04-03 DIAGNOSIS — D23111 Other benign neoplasm of skin of right upper eyelid, including canthus: Secondary | ICD-10-CM | POA: Diagnosis not present

## 2019-04-03 DIAGNOSIS — H0100B Unspecified blepharitis left eye, upper and lower eyelids: Secondary | ICD-10-CM | POA: Diagnosis not present

## 2019-04-03 DIAGNOSIS — H0100A Unspecified blepharitis right eye, upper and lower eyelids: Secondary | ICD-10-CM | POA: Diagnosis not present

## 2019-04-03 DIAGNOSIS — H11001 Unspecified pterygium of right eye: Secondary | ICD-10-CM | POA: Diagnosis not present

## 2019-05-27 ENCOUNTER — Other Ambulatory Visit: Payer: Self-pay

## 2019-05-27 DIAGNOSIS — E785 Hyperlipidemia, unspecified: Secondary | ICD-10-CM

## 2019-06-10 ENCOUNTER — Ambulatory Visit: Payer: BC Managed Care – PPO | Attending: Internal Medicine

## 2019-06-10 DIAGNOSIS — Z23 Encounter for immunization: Secondary | ICD-10-CM

## 2019-06-10 NOTE — Progress Notes (Signed)
   Covid-19 Vaccination Clinic  Name:  Ryan Fisher    MRN: [id number] DOB: June 18, 1970  06/10/2019  Ryan Fisher was observed post Covid-19 immunization for 15 minutes without incident. He was provided with Vaccine Information Sheet and instruction to access the V-Safe system.   Ryan Fisher was instructed to call 911 with any severe reactions post vaccine: Marland Kitchen Difficulty breathing  . Swelling of face and throat  . A fast heartbeat  . A bad rash all over body  . Dizziness and weakness   Immunizations Administered    Name Date Dose VIS Date Route   Pfizer COVID-19 Vaccine 06/10/2019  1:32 PM 0.3 mL 02/22/2019 Intramuscular   Manufacturer: [place]   Lot: [id number]   [place]: [id number]

## 2019-06-12 ENCOUNTER — Ambulatory Visit: Payer: BC Managed Care – PPO | Admitting: Family Medicine

## 2019-06-12 ENCOUNTER — Other Ambulatory Visit: Payer: Self-pay

## 2019-06-12 ENCOUNTER — Encounter: Payer: Self-pay | Admitting: Family Medicine

## 2019-06-12 VITALS — BP 121/82 | HR 78 | Temp 98.0°F | Resp 18 | Ht 75.0 in | Wt 308.0 lb

## 2019-06-12 DIAGNOSIS — N401 Enlarged prostate with lower urinary tract symptoms: Secondary | ICD-10-CM | POA: Diagnosis not present

## 2019-06-12 DIAGNOSIS — I10 Essential (primary) hypertension: Secondary | ICD-10-CM

## 2019-06-12 DIAGNOSIS — R351 Nocturia: Secondary | ICD-10-CM

## 2019-06-12 DIAGNOSIS — R39198 Other difficulties with micturition: Secondary | ICD-10-CM

## 2019-06-12 DIAGNOSIS — E785 Hyperlipidemia, unspecified: Secondary | ICD-10-CM | POA: Diagnosis not present

## 2019-06-12 MED ORDER — AMLODIPINE BESYLATE 5 MG PO TABS: 7.5000 mg | ORAL_TABLET | Freq: Every day | ORAL | 1 refills | Status: DC

## 2019-06-12 MED ORDER — ATORVASTATIN CALCIUM 40 MG PO TABS: 40.0000 mg | ORAL_TABLET | Freq: Every day | ORAL | 3 refills | Status: DC

## 2019-06-12 MED ORDER — LISINOPRIL 40 MG PO TABS: 40.0000 mg | ORAL_TABLET | Freq: Every day | ORAL | 1 refills | Status: DC

## 2019-06-12 MED ORDER — TAMSULOSIN HCL 0.4 MG PO CAPS: 0.4000 mg | ORAL_CAPSULE | Freq: Every day | ORAL | 1 refills | Status: DC

## 2019-06-12 NOTE — Progress Notes (Signed)
Ryan Fisher , 1970/12/07, 49 y.o., male MRN: [id number] Patient Care Team    Relationship Specialty Notifications Start End  Ma Hillock, DO PCP - General Family Medicine  08/30/16   Elsie Stain, MD  Pulmonary Disease Abnormal results only, Admissions 11/09/10     Chief Complaint  Patient presents with  . Hypertension    Needs refills on medications. Fasting.      Subjective: Pt presents for an OV for follow up  hypertension Hypertension/HLD/Obesity:  Pt reports compliance with lisinopril 40 mg daily and amlodipine 7.5 mg daily. Blood pressures ranges at home are WNL. Patient denies chest pain, shortness of breath, dizziness or lower extremity edema.  Pt does not take a daily baby ASA. Pt is  taking his statin daily.  Labs UTD 11/29/2018 Diet: low sodium Exercise: not routinely RF: HTN, obesity, Fhx HD.  Daily smoker.   BPH: Patient reports his urinary stream changes have greatly improved with use of Flomax. He is a smoker.  He has no family history of prostate cancer. PSA was normal 11/29/2018.  Depression screen [facility] 2/9 09/06/2016 08/30/2016  Decreased Interest 0 0  Down, Depressed, Hopeless 0 0  PHQ - 2 Score 0 0    Allergies  Allergen Reactions  . Morphine And Related Other (See Comments)    Morphine only- sweating, flushing.    Social History   Social History Narrative   Married to [employer].    Some college attendance. 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    Drinks caffeine.    Wears a seatbelt and bicycle helmet.    Smoke detector in the home. Firearms locked in the home.    Feels safe in his relationships.    Past Medical History:  Diagnosis Date  . Hyperlipidemia   . Hypertension    Past Surgical History:  Procedure Laterality Date  . ROTATOR CUFF REPAIR Left 2012   left   Family History  Problem Relation Age of Onset  . Allergies Brother   . Asthma Brother   . Heart failure Paternal Grandfather   . Brain cancer Maternal Aunt   . Arthritis  Mother   . Early death Paternal Uncle    Allergies as of 06/12/2019      Reactions   Morphine And Related Other (See Comments)   Morphine only- sweating, flushing.       Medication List       Accurate as of June 12, 2019  3:10 PM. If you have any questions, ask your nurse or doctor.        amLODipine 5 MG tablet Commonly known as: NORVASC Take 1.5 tablets (7.5 mg total) by mouth daily.   atorvastatin 40 MG tablet Commonly known as: LIPITOR Take 1 tablet (40 mg total) by mouth daily.   lisinopril 40 MG tablet Commonly known as: ZESTRIL Take 1 tablet (40 mg total) by mouth daily.   tamsulosin 0.4 MG Caps capsule Commonly known as: FLOMAX Take 1 capsule (0.4 mg total) by mouth daily.       All past medical history, surgical history, allergies, family history, immunizations andmedications were updated in the EMR today and reviewed under the history and medication portions of their EMR.     ROS: Negative, with the exception of above mentioned in HPI   Objective:  BP 121/82 (BP Location: Left Arm, Patient Position: Sitting, Cuff Size: Large)   Pulse 78   Temp 98 F (36.7 C) (Temporal)   Resp 18  Ht 6\' 3"  (1.905 m)   Wt (!) 308 lb (139.7 kg)   SpO2 96%   BMI 38.50 kg/m  Body mass index is 38.5 kg/m. Gen: Afebrile. No acute distress.  HENT: AT. [place].  Eyes:Pupils Equal Round Reactive to light, Extraocular movements intact,  Conjunctiva without redness, discharge or icterus. Neck/lymp/endocrine: Supple,no lymphadenopathy, no thyromegaly CV: RRR no murmur, no edema RLE , trace LLE Chest: CTAB, no wheeze or crackles Neuro:  Normal gait. PERLA. EOMi. Alert. Oriented x3 Psych: Normal affect, dress and demeanor. Normal speech. Normal thought content and judgment.   No exam data present No results found. No results found for this or any previous visit (from the past 24 hour(s)).  Assessment/Plan: Arba Wootan is a 49 y.o. male present for OV for  Essential  hypertension/Morbid obesity (HCC)/HLD - continue  amlodipine to 7.5 mg (1.5 tabs) - continue lisinopril to 40 mg QD.    -He was counseled on low-sodium diet, routine exercise and routine follow-up. - Continue atorvastatin - f/u 6 mos for CPE  BPH:  - stable.  - continue flomax  - f/u 6 mos (CPE)   Reviewed expectations re: course of current medical issues.  Discussed self-management of symptoms.  Outlined signs and symptoms indicating need for more acute intervention.  Patient verbalized understanding and all questions were answered.  Patient received an After-Visit Summary.    No orders of the defined types were placed in this encounter.  Meds ordered this encounter  Medications  . tamsulosin (FLOMAX) 0.4 MG CAPS capsule    Sig: Take 1 capsule (0.4 mg total) by mouth daily.    Dispense:  90 capsule    Refill:  1  . lisinopril (ZESTRIL) 40 MG tablet    Sig: Take 1 tablet (40 mg total) by mouth daily.    Dispense:  90 tablet    Refill:  1  . atorvastatin (LIPITOR) 40 MG tablet    Sig: Take 1 tablet (40 mg total) by mouth daily.    Dispense:  90 tablet    Refill:  3  . amLODipine (NORVASC) 5 MG tablet    Sig: Take 1.5 tablets (7.5 mg total) by mouth daily.    Dispense:  135 tablet    Refill:  1   Referral Orders  No referral(s) requested today     Note is dictated utilizing voice recognition software. Although note has been proof read prior to signing, occasional typographical errors still can be missed. If any questions arise, please do not hesitate to call for verification.   electronically signed by:  Howard Pouch, DO  [place]

## 2019-06-12 NOTE — Patient Instructions (Signed)
I have refilled your medications today.  BP looks great. Next appointment schedule as a physical and we will be completing your labs that visit.

## 2019-07-02 ENCOUNTER — Ambulatory Visit: Payer: BC Managed Care – PPO | Attending: Internal Medicine

## 2019-07-02 DIAGNOSIS — Z23 Encounter for immunization: Secondary | ICD-10-CM

## 2019-07-02 NOTE — Progress Notes (Signed)
   Covid-19 Vaccination Clinic  Name:  Ryan Fisher    MRN: [id number] DOB: Jul 29, 1970  07/02/2019  Ryan Fisher was observed post Covid-19 immunization for 15 minutes without incident. He was provided with Vaccine Information Sheet and instruction to access the V-Safe system.   Ryan Fisher was instructed to call 911 with any severe reactions post vaccine: Marland Kitchen Difficulty breathing  . Swelling of face and throat  . A fast heartbeat  . A bad rash all over body  . Dizziness and weakness   Immunizations Administered    Name Date Dose VIS Date Route   Pfizer COVID-19 Vaccine 07/02/2019  1:33 PM 0.3 mL 05/08/2018 Intramuscular   Manufacturer: [place]   Lot: [id number]   [place]: [id number]

## 2019-10-24 ENCOUNTER — Other Ambulatory Visit: Payer: Self-pay

## 2019-10-24 ENCOUNTER — Other Ambulatory Visit: Payer: BC Managed Care – PPO

## 2019-10-24 DIAGNOSIS — Z20822 Contact with and (suspected) exposure to covid-19: Secondary | ICD-10-CM | POA: Diagnosis not present

## 2019-10-25 LAB — SARS-COV-2, NAA 2 DAY TAT

## 2019-10-25 LAB — NOVEL CORONAVIRUS, NAA: SARS-CoV-2, NAA: NOT DETECTED

## 2019-12-17 ENCOUNTER — Other Ambulatory Visit: Payer: Self-pay | Admitting: Family Medicine

## 2019-12-17 DIAGNOSIS — R39198 Other difficulties with micturition: Secondary | ICD-10-CM

## 2019-12-30 DIAGNOSIS — Z20822 Contact with and (suspected) exposure to covid-19: Secondary | ICD-10-CM | POA: Diagnosis not present

## 2020-01-12 ENCOUNTER — Other Ambulatory Visit: Payer: Self-pay | Admitting: Family Medicine

## 2020-01-12 DIAGNOSIS — R39198 Other difficulties with micturition: Secondary | ICD-10-CM

## 2020-01-26 IMAGING — DX DG FOOT COMPLETE 3+V*R*
3 series · 3 of 3 positions shown · non-contrast
Comparison: None.

CLINICAL DATA: Lateral heel pain for months with no injury.

EXAM:
RIGHT FOOT COMPLETE - 3+ VIEW

[foot ap]
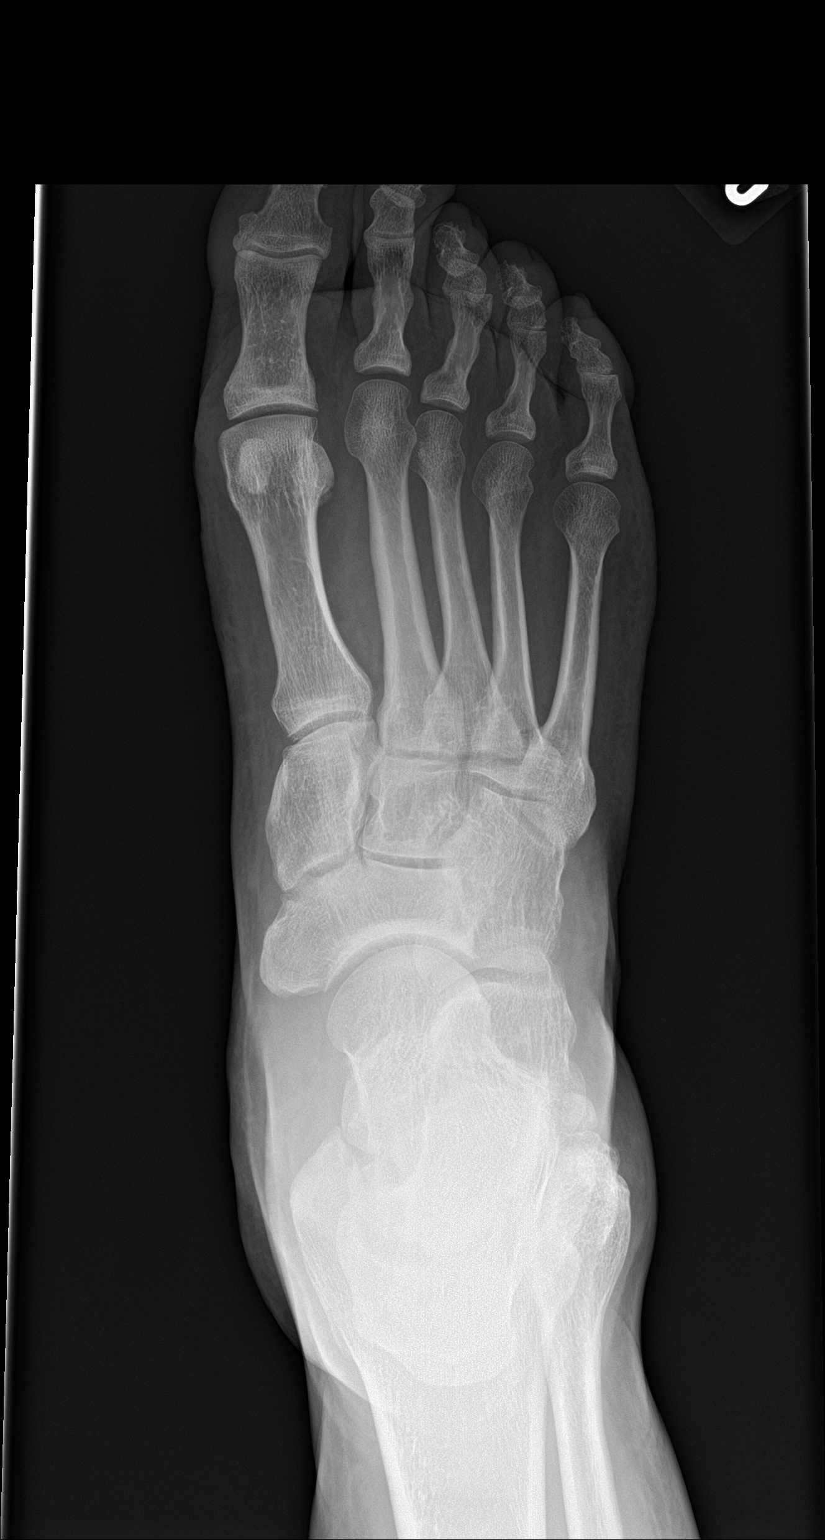

[foot obl]
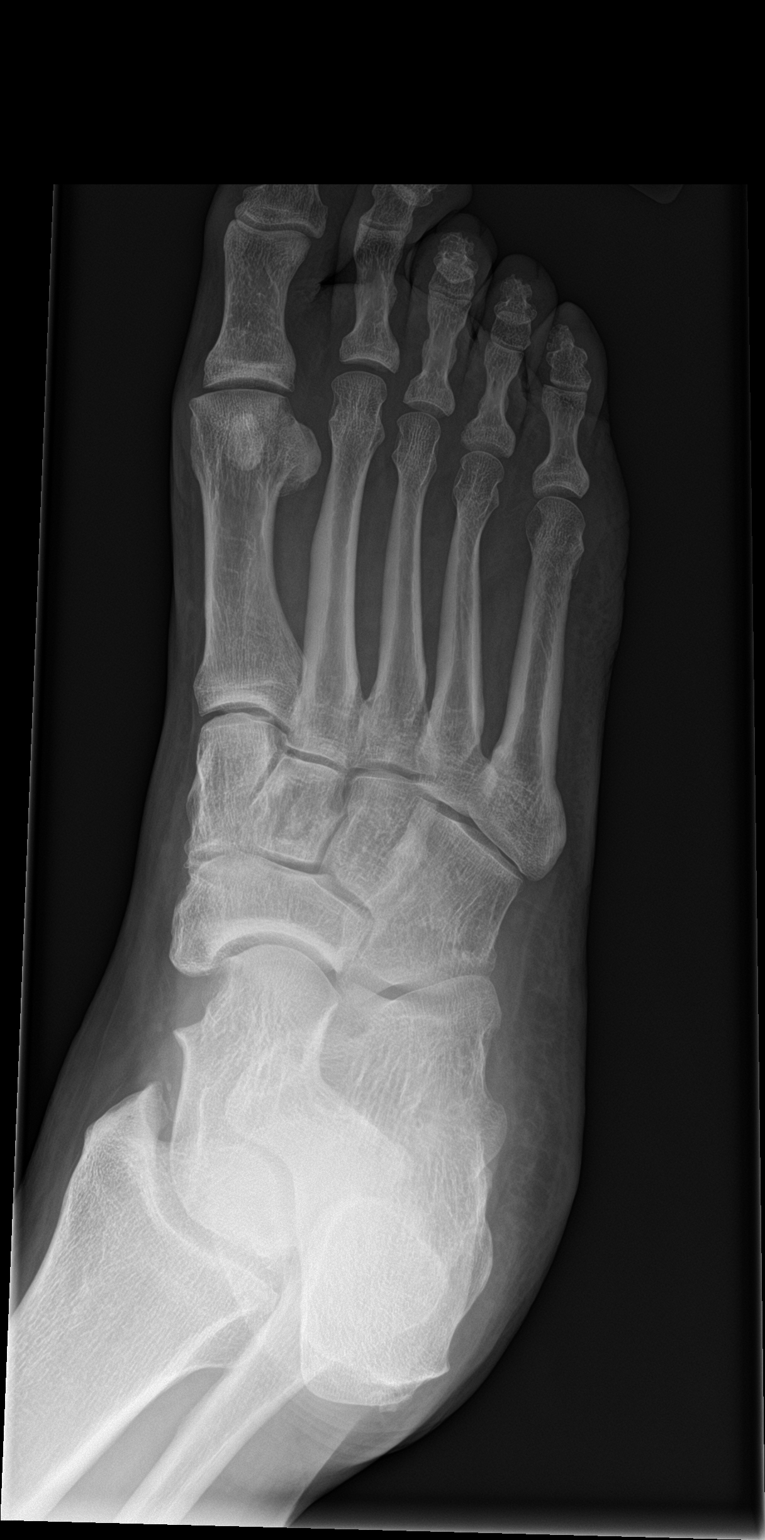

[foot lat]
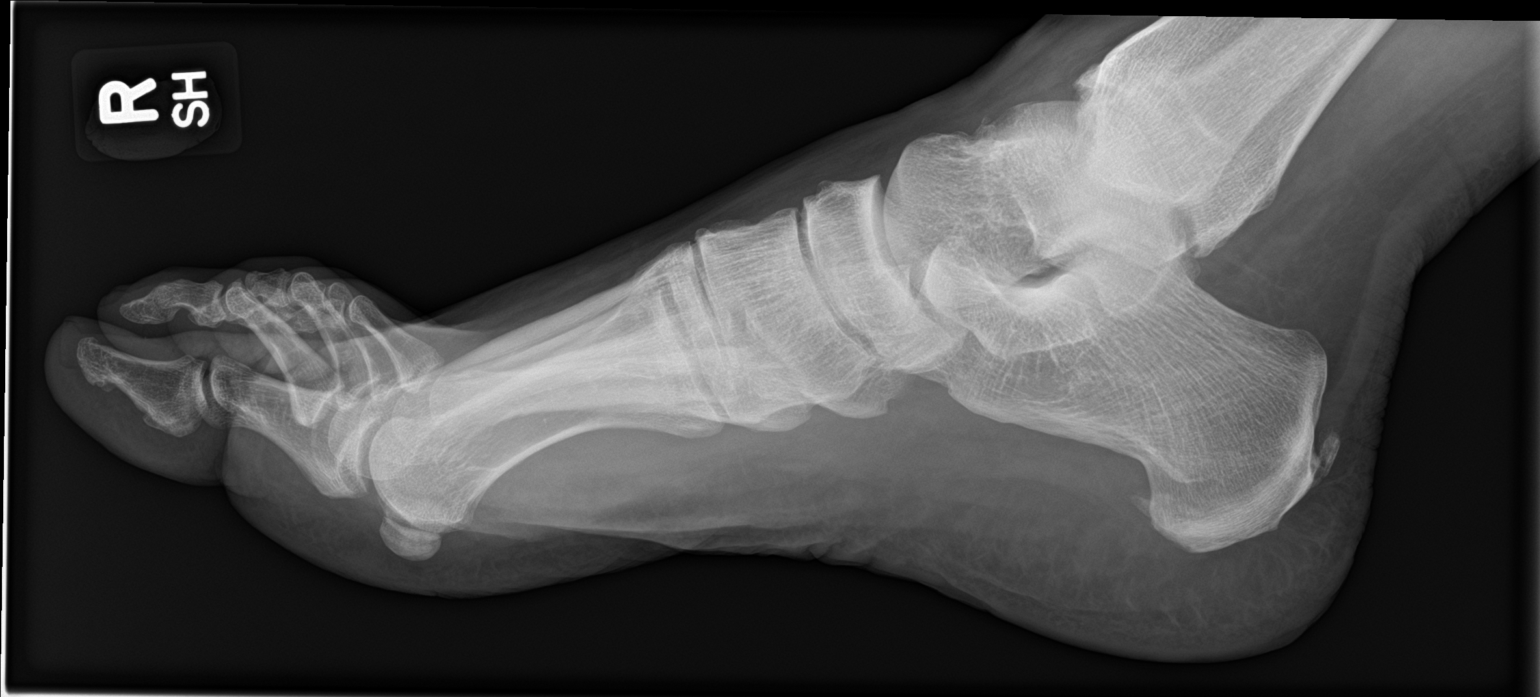

[3 of 3 positions shown; findings below may reference images not displayed]

FINDINGS: No fracture.  No bone lesion.

Joints are normally spaced and aligned.  No arthropathic changes.

Small dorsal and plantar calcaneal spurs.

Soft tissues are unremarkable.
IMPRESSION: 1. No fracture, bone lesion or joint abnormality.
2. Calcaneal spurs.

## 2020-03-12 LAB — HM DIABETES EYE EXAM

## 2020-03-30 ENCOUNTER — Ambulatory Visit: Payer: BC Managed Care – PPO | Admitting: Family Medicine

## 2020-04-01 ENCOUNTER — Other Ambulatory Visit: Payer: Self-pay

## 2020-04-01 ENCOUNTER — Ambulatory Visit: Payer: BC Managed Care – PPO | Admitting: Family Medicine

## 2020-04-01 ENCOUNTER — Encounter: Payer: Self-pay | Admitting: Family Medicine

## 2020-04-01 VITALS — BP 112/78 | HR 92 | Temp 98.5°F | Ht 75.0 in | Wt 314.0 lb

## 2020-04-01 DIAGNOSIS — N401 Enlarged prostate with lower urinary tract symptoms: Secondary | ICD-10-CM | POA: Diagnosis not present

## 2020-04-01 DIAGNOSIS — R351 Nocturia: Secondary | ICD-10-CM | POA: Diagnosis not present

## 2020-04-01 DIAGNOSIS — E785 Hyperlipidemia, unspecified: Secondary | ICD-10-CM | POA: Diagnosis not present

## 2020-04-01 DIAGNOSIS — I10 Essential (primary) hypertension: Secondary | ICD-10-CM | POA: Diagnosis not present

## 2020-04-01 DIAGNOSIS — R39198 Other difficulties with micturition: Secondary | ICD-10-CM

## 2020-04-01 MED ORDER — LISINOPRIL 40 MG PO TABS: 40.0000 mg | ORAL_TABLET | Freq: Every day | ORAL | 1 refills | Status: DC

## 2020-04-01 MED ORDER — ATORVASTATIN CALCIUM 40 MG PO TABS: 40.0000 mg | ORAL_TABLET | Freq: Every day | ORAL | 3 refills | Status: DC

## 2020-04-01 MED ORDER — AMLODIPINE BESYLATE 5 MG PO TABS: 7.5000 mg | ORAL_TABLET | Freq: Every day | ORAL | 1 refills | Status: DC

## 2020-04-01 MED ORDER — TAMSULOSIN HCL 0.4 MG PO CAPS: 0.4000 mg | ORAL_CAPSULE | Freq: Every day | ORAL | 1 refills | Status: DC

## 2020-04-01 NOTE — Progress Notes (Signed)
Ryan Fisher , October 14, 1970, 50 y.o., male MRN: [id number] Patient Care Team    Relationship Specialty Notifications Start End  Ma Hillock, DO PCP - General Family Medicine  08/30/16   Elsie Stain, MD  Pulmonary Disease Abnormal results only, Admissions 11/09/10     Chief Complaint  Patient presents with  . Follow-up    CMC;      Subjective: Pt presents for an OV for follow up  hypertension Hypertension/HLD/Obesity:  Pt reports compliance with lisinopril 40 mg daily and amlodipine 7.5 mg daily. Blood pressures ranges at home are WNL.Patient denies chest pain, shortness of breath, dizziness or lower extremity edema.  Pt does not take a daily baby ASA. Pt is  taking his statin daily.  Labs due Diet: low sodium Exercise: not routinely RF: HTN, obesity, Fhx HD.  Daily smoker.   BPH: Pt reports his condition is stable with flomax use.  Prior note:  Patient reports his urinary stream changes have greatly improved with use of Flomax. He is a smoker.  He has no family history of prostate cancer. PSA was normal 11/29/2018.  Depression screen [facility] 2/9 04/01/2020 09/06/2016 08/30/2016  Decreased Interest 0 0 0  Down, Depressed, Hopeless 0 0 0  PHQ - 2 Score 0 0 0    Allergies  Allergen Reactions  . Morphine And Related Other (See Comments)    Morphine only- sweating, flushing.    Social History   Social History Narrative   Married to [employer].    Some college attendance. 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    Drinks caffeine.    Wears a seatbelt and bicycle helmet.    Smoke detector in the home. Firearms locked in the home.    Feels safe in his relationships.    Past Medical History:  Diagnosis Date  . Hyperlipidemia   . Hypertension    Past Surgical History:  Procedure Laterality Date  . ROTATOR CUFF REPAIR Left 2012   left   Family History  Problem Relation Age of Onset  . Allergies Brother   . Asthma Brother   . Heart failure Paternal Grandfather   . Brain  cancer Maternal Aunt   . Arthritis Mother   . Early death Paternal Uncle    Allergies as of 04/01/2020      Reactions   Morphine And Related Other (See Comments)   Morphine only- sweating, flushing.       Medication List       Accurate as of April 01, 2020  2:41 PM. If you have any questions, ask your nurse or doctor.        amLODipine 5 MG tablet Commonly known as: NORVASC Take 1.5 tablets (7.5 mg total) by mouth daily.   atorvastatin 40 MG tablet Commonly known as: LIPITOR Take 1 tablet (40 mg total) by mouth daily.   lisinopril 40 MG tablet Commonly known as: ZESTRIL Take 1 tablet (40 mg total) by mouth daily.   tamsulosin 0.4 MG Caps capsule Commonly known as: FLOMAX Take 1 capsule (0.4 mg total) by mouth daily.       All past medical history, surgical history, allergies, family history, immunizations andmedications were updated in the EMR today and reviewed under the history and medication portions of their EMR.     ROS: Negative, with the exception of above mentioned in HPI   Objective:  BP 112/78   Pulse 92   Temp 98.5 F (36.9 C) (Oral)   Ht  '6\' 3"'  (1.905 m)   Wt (!) 314 lb (142.4 kg)   SpO2 98%   BMI 39.25 kg/m  Body mass index is 39.25 kg/m. Gen: Afebrile. No acute distress. Nontoxic, obese male.  HENT: AT. [place].  Eyes:Pupils Equal Round Reactive to light, Extraocular movements intact,  Conjunctiva without redness, discharge or icterus. Neck/lymp/endocrine: Supple,no lymphadenopathy, no thyromegaly CV: RRR no murmur, no edema, +2/4 P posterior tibialis pulses Chest: CTAB, no wheeze or crackles Abd: Soft. NTND. BS present. no Masses palpated.  Skin: no rashes, purpura or petechiae.  Neuro:  Normal gait. PERLA. EOMi. Alert. Oriented x3  Psych: Normal affect, dress and demeanor. Normal speech. Normal thought content and judgment..    No exam data present No results found. No results found for this or any previous visit (from the past 24  hour(s)).  Assessment/Plan: Ryan Fisher is a 50 y.o. male present for OV for  Essential hypertension/Morbid obesity (HCC)/HLD - stable. Looks great - continue amlodipine to 7.5 mg (1.5 tabs) - continue lisinopril to 40 mg QD.    -He was counseled on low-sodium diet, routine exercise and routine follow-up. - Continue atorvastatin - cbc, bmp, tsh, lipid collected today.   BPH:  - Stable.  - continue  flomax   F/u 5.5 mos.    Reviewed expectations re: course of current medical issues.  Discussed self-management of symptoms.  Outlined signs and symptoms indicating need for more acute intervention.  Patient verbalized understanding and all questions were answered.  Patient received an After-Visit Summary.    Orders Placed This Encounter  Procedures  . Comp Met (CMET)  . CBC  . TSH  . Lipid panel   Meds ordered this encounter  Medications  . tamsulosin (FLOMAX) 0.4 MG CAPS capsule    Sig: Take 1 capsule (0.4 mg total) by mouth daily.    Dispense:  90 capsule    Refill:  1  . lisinopril (ZESTRIL) 40 MG tablet    Sig: Take 1 tablet (40 mg total) by mouth daily.    Dispense:  90 tablet    Refill:  1  . atorvastatin (LIPITOR) 40 MG tablet    Sig: Take 1 tablet (40 mg total) by mouth daily.    Dispense:  90 tablet    Refill:  3  . amLODipine (NORVASC) 5 MG tablet    Sig: Take 1.5 tablets (7.5 mg total) by mouth daily.    Dispense:  135 tablet    Refill:  1   Referral Orders  No referral(s) requested today     Note is dictated utilizing voice recognition software. Although note has been proof read prior to signing, occasional typographical errors still can be missed. If any questions arise, please do not hesitate to call for verification.   electronically signed by:  Howard Pouch, DO  [place]

## 2020-04-01 NOTE — Patient Instructions (Signed)
BP looks great.  We will call you with lab results as soon as we get them.   Next appt July 5th, unless needed sooner.

## 2020-04-02 ENCOUNTER — Telehealth: Payer: Self-pay | Admitting: Family Medicine

## 2020-04-02 DIAGNOSIS — R7309 Other abnormal glucose: Secondary | ICD-10-CM

## 2020-04-02 LAB — LIPID PANEL
Cholesterol: 146 mg/dL (ref <200)
HDL: 36 mg/dL — ABNORMAL LOW (ref >=40)
LDL Cholesterol (Calc): 81 mg/dL (calc)
Non-HDL Cholesterol (Calc): 110 mg/dL (calc) (ref <130)
Total CHOL/HDL Ratio: 4.1 (calc) (ref <5.0)
Triglycerides: 191 mg/dL — ABNORMAL HIGH (ref <150)

## 2020-04-02 LAB — COMPREHENSIVE METABOLIC PANEL
AG Ratio: 1.4 (calc) (ref 1.0–2.5)
ALT: 45 U/L (ref 9–46)
AST: 36 U/L (ref 10–40)
Albumin: 4.3 g/dL (ref 3.6–5.1)
Alkaline phosphatase (APISO): 74 U/L (ref 36–130)
BUN: 13 mg/dL (ref 7–25)
CO2: 25 mmol/L (ref 20–32)
Calcium: 9.7 mg/dL (ref 8.6–10.3)
Chloride: 105 mmol/L (ref 98–110)
Creat: 1.21 mg/dL (ref 0.60–1.35)
Globulin: 3 g/dL (calc) (ref 1.9–3.7)
Glucose, Bld: 169 mg/dL — ABNORMAL HIGH (ref 65–99)
Potassium: 4.2 mmol/L (ref 3.5–5.3)
Sodium: 138 mmol/L (ref 135–146)
Total Bilirubin: 0.9 mg/dL (ref 0.2–1.2)
Total Protein: 7.3 g/dL (ref 6.1–8.1)

## 2020-04-02 LAB — CBC
HCT: 48 % (ref 38.5–50.0)
Hemoglobin: 16.5 g/dL (ref 13.2–17.1)
MCH: 30.8 pg (ref 27.0–33.0)
MCHC: 34.4 g/dL (ref 32.0–36.0)
MCV: 89.6 fL (ref 80.0–100.0)
MPV: 11.6 fL (ref 7.5–12.5)
Platelets: 274 10*3/uL (ref 140–400)
RBC: 5.36 10*6/uL (ref 4.20–5.80)
RDW: 12.8 % (ref 11.0–15.0)
WBC: 12.2 10*3/uL — ABNORMAL HIGH (ref 3.8–10.8)

## 2020-04-02 LAB — TSH: TSH: 2.48 mIU/L (ref 0.40–4.50)

## 2020-04-02 NOTE — Telephone Encounter (Signed)
Patient advised and voiced understanding.  Scheduled lab appt

## 2020-04-02 NOTE — Telephone Encounter (Signed)
Please call patient -Liver, kidney and thyroid function are normal -Blood cell counts and electrolytes are normal-with the exception of his sugar/glucose.  He reported he was fasting for his labs-therefore normal glucose should have been 70-99, and his was 169.  We need to get him in for a lab appointment only to have his A1c and glucose checked fasting to ensure he is not becoming a diabetic. -Cholesterol panel looks good and is bad cholesterol/LDL is at goal at 81.  His triglycerides are mildly elevated at 191, goal is less than 150.  He can decrease his triglycerides with increasing exercise, decreasing saturated/fatty foods in his diet and starting a fiber supplement.  Metamucil comes in all types of formats now (not just powders), which can help lower triglycerides.    Please schedule him for the lab appointment.  Again remind him to fast.  I have placed the orders.  We will call him with those results once received

## 2020-04-06 ENCOUNTER — Other Ambulatory Visit: Payer: Self-pay

## 2020-04-06 ENCOUNTER — Ambulatory Visit (INDEPENDENT_AMBULATORY_CARE_PROVIDER_SITE_OTHER): Payer: BC Managed Care – PPO

## 2020-04-06 DIAGNOSIS — R7309 Other abnormal glucose: Secondary | ICD-10-CM

## 2020-04-07 ENCOUNTER — Telehealth: Payer: Self-pay | Admitting: Family Medicine

## 2020-04-07 DIAGNOSIS — E785 Hyperlipidemia, unspecified: Secondary | ICD-10-CM | POA: Insufficient documentation

## 2020-04-07 DIAGNOSIS — E1169 Type 2 diabetes mellitus with other specified complication: Secondary | ICD-10-CM | POA: Insufficient documentation

## 2020-04-07 DIAGNOSIS — E119 Type 2 diabetes mellitus without complications: Secondary | ICD-10-CM

## 2020-04-07 LAB — HEMOGLOBIN A1C
Hgb A1c MFr Bld: 9.1 % of total Hgb — ABNORMAL HIGH (ref <5.7)
Mean Plasma Glucose: 214 mg/dL
eAG (mmol/L): 11.9 mmol/L

## 2020-04-07 LAB — GLUCOSE, RANDOM: Glucose, Plasma: 225 mg/dL — ABNORMAL HIGH (ref 65–139)

## 2020-04-07 MED ORDER — BLOOD GLUCOSE METER KIT: PACK | 0 refills | Status: DC

## 2020-04-07 MED ORDER — SITAGLIP PHOS-METFORMIN HCL ER 50-1000 MG PO TB24: 1.0000 | ORAL_TABLET | Freq: Every day | ORAL | 1 refills | Status: DC

## 2020-04-07 NOTE — Telephone Encounter (Signed)
Please inform patient the following information: His a1c is 9.1. This is diabetic range. Unfortunately, he now has diabetes type 2.  His goal A1c as a diabetic would be around 6.5 on medications, so we have a good deal of work to do.  Please schedule him for provider appointment for new onset diabetes ASAP (within 2 weeks if possible) -so that we can go over diabetes education, nutrition and treatment plans.  I have called in a medicine called Janumet, it is a mix of 2 diabetes medicines called Metformin and Januvia. 1 tab daily with his evening meal/dinner. Hopefully, his insurance will cover this combo pill so that he does not need to take 2 separate pills. He should also bring his glucose meter and supplies with him to his appointment for nurse instruction on proper use.  [place]: Please call in generic glucose meter and supplies also for him.-Thanks

## 2020-04-07 NOTE — Addendum Note (Signed)
Addended by: Kavin Leech on: 04/07/2020 11:26 AM   Modules accepted: Orders

## 2020-04-07 NOTE — Telephone Encounter (Signed)
Routing to team pool

## 2020-04-07 NOTE — Telephone Encounter (Signed)
LVM for pt to CB regarding results.  

## 2020-04-08 NOTE — Telephone Encounter (Signed)
Patient advised and voiced understanding.  

## 2020-04-21 ENCOUNTER — Other Ambulatory Visit: Payer: Self-pay

## 2020-04-21 ENCOUNTER — Encounter: Payer: Self-pay | Admitting: Family Medicine

## 2020-04-21 ENCOUNTER — Ambulatory Visit: Payer: BC Managed Care – PPO | Admitting: Family Medicine

## 2020-04-21 VITALS — BP 96/68 | HR 90 | Temp 98.4°F | Ht 75.0 in | Wt 309.0 lb

## 2020-04-21 DIAGNOSIS — I10 Essential (primary) hypertension: Secondary | ICD-10-CM

## 2020-04-21 DIAGNOSIS — E785 Hyperlipidemia, unspecified: Secondary | ICD-10-CM

## 2020-04-21 DIAGNOSIS — E119 Type 2 diabetes mellitus without complications: Secondary | ICD-10-CM

## 2020-04-21 MED ORDER — METFORMIN HCL 500 MG PO TABS: 1000.0000 mg | ORAL_TABLET | Freq: Two times a day (BID) | ORAL | 1 refills | Status: DC

## 2020-04-21 MED ORDER — SITAGLIPTIN PHOSPHATE 100 MG PO TABS: 100.0000 mg | ORAL_TABLET | Freq: Every day | ORAL | 1 refills | Status: DC

## 2020-04-21 NOTE — Progress Notes (Signed)
Ryan Fisher , [birth year], 50 y.o., male MRN: [id number] Patient Care Team    Relationship Specialty Notifications Start End  Ma Hillock, DO PCP - General Family Medicine  08/30/16     Chief Complaint  Patient presents with  . Diabetes     Subjective: Pt presents for an OV for new onset diabetes New onset diabetes type 2/morbid obesity: Diagnosed 04/06/2020 Pt has not started medication yet.  It was not approved by insurance.  Denies numbness, tingling of extremities, hypo/hyperglycemic events or non-healing wounds. Pt reports he has not started checking his blood glucoses as of yet.  He is familiar with how to do this, since he used to work for [employer]. PNA series: Completed 09/06/2016 Flu shot: UTD 2021 (recommneded yearly) Microalbumin: We will collect microalbumin on next visit.    Foot exam: Completed 04/21/2020 Eye exam: Patient reports he has had an eye exam in the last few months.  He will have his eye doctor forward exam A1c: 9.1-1/24/2022 with the fasting blood glucose of 225  Hypertension/HLD/Obesity:  Pt reports compliance with lisinopril 40 mg daily and amlodipine 7.5 mg daily. Blood pressures ranges at home are WNL.Patient denies chest pain, shortness of breath, dizziness or lower extremity edema.  Pt does not take a daily baby ASA. Pt is  taking his statin daily.  Labs due Diet: low sodium Exercise: not routinely RF: HTN, obesity, Fhx HD.  Daily smoker.  BPH: Pt reports his condition is stable with flomax use.    Depression screen [facility] 2/9 04/01/2020 09/06/2016 08/30/2016  Decreased Interest 0 0 0  Down, Depressed, Hopeless 0 0 0  PHQ - 2 Score 0 0 0    Allergies  Allergen Reactions  . Morphine And Related Other (See Comments)    Morphine only- sweating, flushing.    Social History   Social History Narrative   Married to [employer].    Some college attendance. 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    Drinks caffeine.    Wears a seatbelt and bicycle helmet.     Smoke detector in the home. Firearms locked in the home.    Feels safe in his relationships.    Past Medical History:  Diagnosis Date  . Hyperlipidemia   . Hypertension    Past Surgical History:  Procedure Laterality Date  . ROTATOR CUFF REPAIR Left 2012   left   Family History  Problem Relation Age of Onset  . Allergies Brother   . Asthma Brother   . Heart failure Paternal Grandfather   . Brain cancer Maternal Aunt   . Arthritis Mother   . Early death Paternal Uncle    Allergies as of 04/21/2020      Reactions   Morphine And Related Other (See Comments)   Morphine only- sweating, flushing.       Medication List       Accurate as of April 21, 2020 11:59 PM. If you have any questions, ask your nurse or doctor.        STOP taking these medications   SitaGLIPtin-MetFORMIN HCl 50-1000 MG Tb24 Stopped by: Howard Pouch, DO     TAKE these medications   amLODipine 5 MG tablet Commonly known as: NORVASC Take 1.5 tablets (7.5 mg total) by mouth daily.   atorvastatin 40 MG tablet Commonly known as: LIPITOR Take 1 tablet (40 mg total) by mouth daily.   blood glucose meter kit and supplies Dispense based on patient and insurance preference. Use up  to four times daily as directed. (FOR ICD-10 E10.9, E11.9).   lisinopril 40 MG tablet Commonly known as: ZESTRIL Take 1 tablet (40 mg total) by mouth daily.   metFORMIN 500 MG tablet Commonly known as: GLUCOPHAGE Take 2 tablets (1,000 mg total) by mouth 2 (two) times daily with a meal. Started by: Howard Pouch, DO   OneTouch Delica Lancets 69G Misc SMARTSIG:Via Meter 1 to 4 Times Daily   OneTouch Verio test strip Generic drug: glucose blood SMARTSIG:Via Meter 1 to 4 Times Daily   sitaGLIPtin 100 MG tablet Commonly known as: Januvia Take 1 tablet (100 mg total) by mouth daily. Started by: Howard Pouch, DO   tamsulosin 0.4 MG Caps capsule Commonly known as: FLOMAX Take 1 capsule (0.4 mg total) by mouth daily.        All past medical history, surgical history, allergies, family history, immunizations andmedications were updated in the EMR today and reviewed under the history and medication portions of their EMR.     ROS: Negative, with the exception of above mentioned in HPI   Objective:  BP 96/68   Pulse 90   Temp 98.4 F (36.9 C) (Oral)   Ht '6\' 3"'  (1.905 m)   Wt (!) 309 lb (140.2 kg)   SpO2 97%   BMI 38.62 kg/m  Body mass index is 38.62 kg/m. Gen: Afebrile. No acute distress.  Nontoxic in appearance, very pleasant obese Caucasian man HENT: AT. [place]. ppearance. MMM.  Eyes:Pupils Equal Round Reactive to light, Extraocular movements intact,  Conjunctiva without redness, discharge or icterus. Neck/lymp/endocrine: Supple, no lymphadenopathy, no thyromegaly CV: RRR no murmur, no edema, +2/4 P posterior tibialis pulses Chest: CTAB, no wheeze or crackles Skin: No rashes, purpura or petechiae.  Neuro:  Normal gait. PERLA. EOMi. Alert. Oriented. x3 Psych: Normal affect, dress and demeanor. Normal speech. Normal thought content and judgment.  Diabetic Foot Exam - Simple   Simple Foot Form Diabetic Foot exam was performed with the following findings: Yes 04/21/2020  3:29 PM  Visual Inspection No deformities, no ulcerations, no other skin breakdown bilaterally: Yes Sensation Testing Intact to touch and monofilament testing bilaterally: Yes Pulse Check Posterior Tibialis and Dorsalis pulse intact bilaterally: Yes Comments      No exam data present No results found. No results found for this or any previous visit (from the past 24 hour(s)).  Assessment/Plan: Ryan Fisher is a 50 y.o. male present for OV for  New onset type 2 diabetes mellitus ([place]) -New onset diabetes with A1c 9.1. -Metformin tapered to 1000 twice daily -Start Januvia 100 mg daily -Goal A1c <7, closer to 6.5 would be best -Patient was instructed on normal glucose range and glucose testing. -Patient was educated on  diabetic diet and glycemic index goals. PNA series: Completed 09/06/2016 Flu shot: UTD 2021 (recommneded yearly) Microalbumin: We will collect microalbumin on next visit.    Foot exam: Completed 04/21/2020 Eye exam: Patient reports he has had an eye exam in the last few months.  He will have his eye doctor forward exam A1c: 9.1-1/24/2022 with the fasting blood glucose of 225  Essential hypertension/Morbid obesity (HCC)/HLD - stable. Looks great - continue amlodipine to 7.5 mg (1.5 tabs) - continue lisinopril to 40 mg QD.    -He was counseled on low-sodium diet, routine exercise and routine follow-up. - Continue atorvastatin - cbc, bmp, tsh, lipid collected today.   BPH:  - stable - continue  flomax      Reviewed expectations re: course of current  medical issues.  Discussed self-management of symptoms.  Outlined signs and symptoms indicating need for more acute intervention.  Patient verbalized understanding and all questions were answered.  Patient received an After-Visit Summary.    No orders of the defined types were placed in this encounter.  Meds ordered this encounter  Medications  . DISCONTD: metFORMIN (GLUCOPHAGE) 500 MG tablet    Sig: Take 2 tablets (1,000 mg total) by mouth 2 (two) times daily with a meal.    Dispense:  180 tablet    Refill:  1  . sitaGLIPtin (JANUVIA) 100 MG tablet    Sig: Take 1 tablet (100 mg total) by mouth daily.    Dispense:  90 tablet    Refill:  1  . metFORMIN (GLUCOPHAGE) 500 MG tablet    Sig: Take 2 tablets (1,000 mg total) by mouth 2 (two) times daily with a meal.    Dispense:  360 tablet    Refill:  1   Referral Orders  No referral(s) requested today     Note is dictated utilizing voice recognition software. Although note has been proof read prior to signing, occasional typographical errors still can be missed. If any questions arise, please do not hesitate to call for verification.   electronically signed by:  Howard Pouch, DO  [place]

## 2020-04-21 NOTE — Patient Instructions (Addendum)
Januvia 1 tab daily.  metformin 1 tab for 2 weeks then 1 tab every 12 hours.  Glycemic  Index.- you want a lower glycemic index diet.  A1c goal 6.5.  Your a1c is 9.1. Exercise > 150 minutes a week.  Yearly eye exam Foot exam daily. 3 months.       Diabetes Mellitus and Nutrition, Adult When you have diabetes, or diabetes mellitus, it is very important to have healthy eating habits because your blood sugar (glucose) levels are greatly affected by what you eat and drink. Eating healthy foods in the right amounts, at about the same times every day, can help you:  Control your blood glucose.  Lower your risk of heart disease.  Improve your blood pressure.  Reach or maintain a healthy weight. What can affect my meal plan? Every person with diabetes is different, and each person has different needs for a meal plan. Your health care provider may recommend that you work with a dietitian to make a meal plan that is best for you. Your meal plan may vary depending on factors such as:  The calories you need.  The medicines you take.  Your weight.  Your blood glucose, blood pressure, and cholesterol levels.  Your activity level.  Other health conditions you have, such as heart or kidney disease. How do carbohydrates affect me? Carbohydrates, also called carbs, affect your blood glucose level more than any other type of food. Eating carbs naturally raises the amount of glucose in your blood. Carb counting is a method for keeping track of how many carbs you eat. Counting carbs is important to keep your blood glucose at a healthy level, especially if you use insulin or take certain oral diabetes medicines. It is important to know how many carbs you can safely have in each meal. This is different for every person. Your dietitian can help you calculate how many carbs you should have at each meal and for each snack. How does alcohol affect me? Alcohol can cause a sudden decrease in blood  glucose (hypoglycemia), especially if you use insulin or take certain oral diabetes medicines. Hypoglycemia can be a life-threatening condition. Symptoms of hypoglycemia, such as sleepiness, dizziness, and confusion, are similar to symptoms of having too much alcohol.  Do not drink alcohol if: ? Your health care provider tells you not to drink. ? You are pregnant, may be pregnant, or are planning to become pregnant.  If you drink alcohol: ? Do not drink on an empty stomach. ? Limit how much you use to:  0-1 drink a day for women.  0-2 drinks a day for men. ? Be aware of how much alcohol is in your drink. In the [place] one drink equals one 12 oz bottle of beer (355 mL), one 5 oz glass of wine (148 mL), or one 1 oz glass of hard liquor (44 mL). ? Keep yourself hydrated with water, diet soda, or unsweetened iced tea.  Keep in mind that regular soda, juice, and other mixers may contain a lot of sugar and must be counted as carbs. What are tips for following this plan? Reading food labels  Start by checking the serving size on the "Nutrition Facts" label of packaged foods and drinks. The amount of calories, carbs, fats, and other nutrients listed on the label is based on one serving of the item. Many items contain more than one serving per package.  Check the total grams (g) of carbs in one serving. You can calculate  the number of servings of carbs in one serving by dividing the total carbs by 15. For example, if a food has 30 g of total carbs per serving, it would be equal to 2 servings of carbs.  Check the number of grams (g) of saturated fats and trans fats in one serving. Choose foods that have a low amount or none of these fats.  Check the number of milligrams (mg) of salt (sodium) in one serving. Most people should limit total sodium intake to less than 2,300 mg per day.  Always check the nutrition information of foods labeled as "low-fat" or "nonfat." These foods may be higher in added  sugar or refined carbs and should be avoided.  Talk to your dietitian to identify your daily goals for nutrients listed on the label. Shopping  Avoid buying canned, pre-made, or processed foods. These foods tend to be high in fat, sodium, and added sugar.  Shop around the outside edge of the grocery store. This is where you will most often find fresh fruits and vegetables, bulk grains, fresh meats, and fresh dairy. Cooking  Use low-heat cooking methods, such as baking, instead of high-heat cooking methods like deep frying.  Cook using healthy oils, such as olive, canola, or sunflower oil.  Avoid cooking with butter, cream, or high-fat meats. Meal planning  Eat meals and snacks regularly, preferably at the same times every day. Avoid going long periods of time without eating.  Eat foods that are high in fiber, such as fresh fruits, vegetables, beans, and whole grains. Talk with your dietitian about how many servings of carbs you can eat at each meal.  Eat 4-6 oz (112-168 g) of lean protein each day, such as lean meat, chicken, fish, eggs, or tofu. One ounce (oz) of lean protein is equal to: ? 1 oz (28 g) of meat, chicken, or fish. ? 1 egg. ?  cup (62 g) of tofu.  Eat some foods each day that contain healthy fats, such as avocado, nuts, seeds, and fish.   What foods should I eat? Fruits Berries. Apples. Oranges. Peaches. Apricots. Plums. Grapes. Mango. Papaya. Pomegranate. Kiwi. Cherries. Vegetables Lettuce. Spinach. Leafy greens, including kale, chard, collard greens, and mustard greens. Beets. Cauliflower. Cabbage. Broccoli. Carrots. Green beans. Tomatoes. Peppers. Onions. Cucumbers. Brussels sprouts. Grains Whole grains, such as whole-wheat or whole-grain bread, crackers, tortillas, cereal, and pasta. Unsweetened oatmeal. Quinoa. Brown or wild rice. Meats and other proteins Seafood. Poultry without skin. Lean cuts of poultry and beef. Tofu. Nuts. Seeds. Dairy Low-fat or  fat-free dairy products such as milk, yogurt, and cheese. The items listed above may not be a complete list of foods and beverages you can eat. Contact a dietitian for more information. What foods should I avoid? Fruits Fruits canned with syrup. Vegetables Canned vegetables. Frozen vegetables with butter or cream sauce. Grains Refined white flour and flour products such as bread, pasta, snack foods, and cereals. Avoid all processed foods. Meats and other proteins Fatty cuts of meat. Poultry with skin. Breaded or fried meats. Processed meat. Avoid saturated fats. Dairy Full-fat yogurt, cheese, or milk. Beverages Sweetened drinks, such as soda or iced tea. The items listed above may not be a complete list of foods and beverages you should avoid. Contact a dietitian for more information. Questions to ask a health care provider  Do I need to meet with a diabetes educator?  Do I need to meet with a dietitian?  What number can I call if I have  questions?  When are the best times to check my blood glucose? Where to find more information:  American Diabetes Association: diabetes.org  Academy of Nutrition and Dietetics: www.eatright.[employer] of Diabetes and Digestive and Kidney Diseases: [employer]  Association of Diabetes Care and Education Specialists: www.diabeteseducator.org Summary  It is important to have healthy eating habits because your blood sugar (glucose) levels are greatly affected by what you eat and drink.  A healthy meal plan will help you control your blood glucose and maintain a healthy lifestyle.  Your health care provider may recommend that you work with a dietitian to make a meal plan that is best for you.  Keep in mind that carbohydrates (carbs) and alcohol have immediate effects on your blood glucose levels. It is important to count carbs and to use alcohol carefully. This information is not intended to replace advice given to you by your  health care provider. Make sure you discuss any questions you have with your health care provider. Document Revised: 02/05/2019 Document Reviewed: 02/05/2019 Elsevier Patient Education  2021 [employer].

## 2020-04-28 ENCOUNTER — Other Ambulatory Visit: Payer: Self-pay

## 2020-04-28 MED ORDER — ONETOUCH VERIO VI STRP: ORAL_STRIP | 1 refills | Status: DC

## 2020-06-23 ENCOUNTER — Telehealth: Payer: Self-pay

## 2020-06-23 NOTE — Telephone Encounter (Signed)
Patient called after hours stating his insurance dropped coverage for Januvia and now cost $500/month.  Patient advised to call insurance and determine what medication will be covered. Patient to inform PCP via MyChart.

## 2020-06-24 NOTE — Telephone Encounter (Signed)
Patient calling back about his Januvia.  He found out that he could get a 90 d/s for less than $500/month if prescription was filled at [facility].  Patient request new prescription for 90 d/s  sitaGLIPtin (JANUVIA) 100 MG tablet [[id number]]   New pharmacy location, please update.  [place]

## 2020-06-24 NOTE — Telephone Encounter (Signed)
LVM for pt to call new pharmacy and have meds transferred from old pharmacy.

## 2020-06-25 ENCOUNTER — Other Ambulatory Visit: Payer: Self-pay

## 2020-06-25 MED ORDER — ONETOUCH VERIO VI STRP: ORAL_STRIP | 1 refills | Status: DC

## 2020-06-25 MED ORDER — ONETOUCH DELICA LANCETS 33G MISC: 5 refills | Status: DC

## 2020-07-21 ENCOUNTER — Other Ambulatory Visit: Payer: Self-pay

## 2020-07-21 DIAGNOSIS — R39198 Other difficulties with micturition: Secondary | ICD-10-CM

## 2020-07-21 DIAGNOSIS — E785 Hyperlipidemia, unspecified: Secondary | ICD-10-CM

## 2020-07-21 MED ORDER — ONETOUCH VERIO VI STRP: ORAL_STRIP | 1 refills | Status: DC

## 2020-07-21 MED ORDER — TAMSULOSIN HCL 0.4 MG PO CAPS: 0.4000 mg | ORAL_CAPSULE | Freq: Every day | ORAL | 0 refills | Status: DC

## 2020-07-21 MED ORDER — ONETOUCH DELICA LANCETS 33G MISC: 5 refills | Status: DC

## 2020-07-21 MED ORDER — AMLODIPINE BESYLATE 5 MG PO TABS: 7.5000 mg | ORAL_TABLET | Freq: Every day | ORAL | 0 refills | Status: DC

## 2020-07-21 MED ORDER — ATORVASTATIN CALCIUM 40 MG PO TABS: 40.0000 mg | ORAL_TABLET | Freq: Every day | ORAL | 0 refills | Status: DC

## 2020-07-21 MED ORDER — SITAGLIPTIN PHOSPHATE 100 MG PO TABS: 100.0000 mg | ORAL_TABLET | Freq: Every day | ORAL | 0 refills | Status: DC

## 2020-07-21 MED ORDER — LISINOPRIL 40 MG PO TABS: 40.0000 mg | ORAL_TABLET | Freq: Every day | ORAL | 0 refills | Status: DC

## 2020-07-21 MED ORDER — METFORMIN HCL 500 MG PO TABS: 1000.0000 mg | ORAL_TABLET | Freq: Two times a day (BID) | ORAL | 0 refills | Status: DC

## 2020-07-24 ENCOUNTER — Ambulatory Visit: Payer: BC Managed Care – PPO | Admitting: Family Medicine

## 2020-08-04 ENCOUNTER — Other Ambulatory Visit: Payer: Self-pay

## 2020-08-04 ENCOUNTER — Encounter: Payer: Self-pay | Admitting: Family Medicine

## 2020-08-04 ENCOUNTER — Ambulatory Visit: Payer: BC Managed Care – PPO | Admitting: Family Medicine

## 2020-08-04 VITALS — BP 113/75 | HR 81 | Temp 98.5°F | Ht 75.0 in | Wt 286.0 lb

## 2020-08-04 DIAGNOSIS — L989 Disorder of the skin and subcutaneous tissue, unspecified: Secondary | ICD-10-CM

## 2020-08-04 DIAGNOSIS — I10 Essential (primary) hypertension: Secondary | ICD-10-CM

## 2020-08-04 DIAGNOSIS — N401 Enlarged prostate with lower urinary tract symptoms: Secondary | ICD-10-CM

## 2020-08-04 DIAGNOSIS — R351 Nocturia: Secondary | ICD-10-CM

## 2020-08-04 DIAGNOSIS — E119 Type 2 diabetes mellitus without complications: Secondary | ICD-10-CM | POA: Diagnosis not present

## 2020-08-04 DIAGNOSIS — R39198 Other difficulties with micturition: Secondary | ICD-10-CM

## 2020-08-04 DIAGNOSIS — E785 Hyperlipidemia, unspecified: Secondary | ICD-10-CM

## 2020-08-04 LAB — POCT GLYCOSYLATED HEMOGLOBIN (HGB A1C)
HbA1c POC (<> result, manual entry): 5.3 % (ref 4.0–5.6)
HbA1c, POC (controlled diabetic range): 5.3 % (ref 0.0–7.0)
HbA1c, POC (prediabetic range): 5.3 % — AB (ref 5.7–6.4)
Hemoglobin A1C: 5.3 % (ref 4.0–5.6)

## 2020-08-04 MED ORDER — TAMSULOSIN HCL 0.4 MG PO CAPS: 0.4000 mg | ORAL_CAPSULE | Freq: Every day | ORAL | 1 refills | Status: DC

## 2020-08-04 MED ORDER — LISINOPRIL 40 MG PO TABS: 40.0000 mg | ORAL_TABLET | Freq: Every day | ORAL | 1 refills | Status: DC

## 2020-08-04 MED ORDER — AMLODIPINE BESYLATE 5 MG PO TABS: 7.5000 mg | ORAL_TABLET | Freq: Every day | ORAL | 1 refills | Status: DC

## 2020-08-04 MED ORDER — METFORMIN HCL 1000 MG PO TABS: 1000.0000 mg | ORAL_TABLET | Freq: Two times a day (BID) | ORAL | 1 refills | Status: DC

## 2020-08-04 MED ORDER — ATORVASTATIN CALCIUM 40 MG PO TABS: 40.0000 mg | ORAL_TABLET | Freq: Every day | ORAL | 1 refills | Status: DC

## 2020-08-04 NOTE — Progress Notes (Addendum)
Ryan Fisher , [birth year], 50 y.o., male MRN: [id number] Patient Care Team    Relationship Specialty Notifications Start End  Ma Hillock, DO PCP - General Family Medicine  08/30/16   Celestia Khat, [place]  Optometry  08/04/20     Chief Complaint  Patient presents with  . Follow-up    [facility]; pt is fasting     Subjective: Pt presents for an OV for diabetes diabetes type 2/morbid obesity: Diagnosed 04/06/2020 Pt reports compliance with metformin 1000 mg twice daily and Janumet 100 mg daily. Denies numbness, tingling of extremities, hypo/hyperglycemic events or non-healing wounds.  PNA series: Completed 09/06/2016 Flu shot: UTD 2021 (recommneded yearly) Microalbumin: We will collect microalbumin on next visit.    Foot exam: Completed 04/21/2020 Eye exam: Completed 03/12/2020 A1c: 9.1> 5.3  Hypertension/HLD/Obesity:  Pt reports  appliance with lisinopril 40 mg daily and amlodipine 7.5 mg daily. Blood pressures ranges at home are WNL.Patient denies chest pain, shortness of breath, dizziness or lower extremity edema.  Pt does not take a daily baby ASA. Pt is  taking his statin daily.  Labs due Diet: low sodium Exercise: not routinely RF: HTN, obesity, Fhx HD.  Daily smoker.  BPH: Pt reports his condition is stable with flomax use.    Depression screen [facility] 2/9 04/01/2020 09/06/2016 08/30/2016  Decreased Interest 0 0 0  Down, Depressed, Hopeless 0 0 0  PHQ - 2 Score 0 0 0    Allergies  Allergen Reactions  . Morphine And Related Other (See Comments)    Morphine only- sweating, flushing.    Social History   Social History Narrative   Married to [employer].    Some college attendance. 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    Drinks caffeine.    Wears a seatbelt and bicycle helmet.    Smoke detector in the home. Firearms locked in the home.    Feels safe in his relationships.    Past Medical History:  Diagnosis Date  . Hyperlipidemia   . Hypertension    Past Surgical History:   Procedure Laterality Date  . ROTATOR CUFF REPAIR Left 2012   left   Family History  Problem Relation Age of Onset  . Allergies Brother   . Asthma Brother   . Heart failure Paternal Grandfather   . Brain cancer Maternal Aunt   . Arthritis Mother   . Early death Paternal Uncle    Allergies as of 08/04/2020      Reactions   Morphine And Related Other (See Comments)   Morphine only- sweating, flushing.       Medication List       Accurate as of Aug 04, 2020  4:14 PM. If you have any questions, ask your nurse or doctor.        STOP taking these medications   blood glucose meter kit and supplies Stopped by: Howard Pouch, DO   OneTouch Delica Lancets 27P Misc Stopped by: Howard Pouch, DO   OneTouch Verio test strip Generic drug: glucose blood Stopped by: Howard Pouch, DO   sitaGLIPtin 100 MG tablet Commonly known as: Januvia Stopped by: Howard Pouch, DO     TAKE these medications   amLODipine 5 MG tablet Commonly known as: NORVASC Take 1.5 tablets (7.5 mg total) by mouth daily.   atorvastatin 40 MG tablet Commonly known as: LIPITOR Take 1 tablet (40 mg total) by mouth daily.   lisinopril 40 MG tablet Commonly known as: ZESTRIL Take 1 tablet (40  mg total) by mouth daily.   metFORMIN 1000 MG tablet Commonly known as: GLUCOPHAGE Take 1 tablet (1,000 mg total) by mouth 2 (two) times daily with a meal. What changed: medication strength Changed by: Howard Pouch, DO   tamsulosin 0.4 MG Caps capsule Commonly known as: FLOMAX Take 1 capsule (0.4 mg total) by mouth daily.       All past medical history, surgical history, allergies, family history, immunizations andmedications were updated in the EMR today and reviewed under the history and medication portions of their EMR.     ROS: Negative, with the exception of above mentioned in HPI   Objective:  BP 113/75   Pulse 81   Temp 98.5 F (36.9 C) (Oral)   Ht 6' 3" (1.905 m)   Wt 286 lb (129.7 kg)   SpO2  99%   BMI 35.75 kg/m  Body mass index is 35.75 kg/m. Gen: Afebrile. No acute distress.  Nontoxic, very pleasant male HENT: AT. [place].  Eyes:Pupils Equal Round Reactive to light, Extraocular movements intact,  Conjunctiva without redness, discharge or icterus. Neck/lymp/endocrine: Supple, no lymphadenopathy, no thyromegaly CV: RRR no murmur, no edema, +2/4 P posterior tibialis pulses Chest: CTAB, no wheeze or crackles Abd: Soft. NTND. BS present.  No masses palpated.  Skin: hyperpigmented skin lesion of right cheek. No rashes, purpura or petechiae.  Neuro:  Normal gait. PERLA. EOMi. Alert. Oriented x3 Psych: Normal affect, dress and demeanor. Normal speech. Normal thought content and judgment..   Diabetic Foot Exam - Simple   Simple Foot Form Diabetic Foot exam was performed with the following findings: Yes 08/04/2020  4:11 PM  Visual Inspection No deformities, no ulcerations, no other skin breakdown bilaterally: Yes Sensation Testing Intact to touch and monofilament testing bilaterally: Yes Pulse Check Posterior Tibialis and Dorsalis pulse intact bilaterally: Yes Comments      No exam data present No results found. Results for orders placed or performed in visit on 08/04/20 (from the past 24 hour(s))  POCT HgB A1C     Status: Abnormal   Collection Time: 08/04/20  3:35 PM  Result Value Ref Range   Hemoglobin A1C 5.3 4.0 - 5.6 %   HbA1c POC (<> result, manual entry) 5.3 4.0 - 5.6 %   HbA1c, POC (prediabetic range) 5.3 (A) 5.7 - 6.4 %   HbA1c, POC (controlled diabetic range) 5.3 0.0 - 7.0 %    Assessment/Plan: Ryan Fisher is a 50 y.o. male present for OV for  New onset type 2 diabetes mellitus ([place]) -Congratulated him.  He is doing excellent.  He has lost 23 pounds! -Continue metformin 1000 mg daily -DC Januvia 100 mg daily- doing great.  -Goal A1c <7, closer to 6.5 would be best -Patient was instructed on normal glucose range and glucose testing. -Patient was educated on  diabetic diet and glycemic index goals. PNA series: Completed 09/06/2016 Flu shot: UTD 2021 (recommneded yearly) Foot exam: Completed 08/04/2020 Eye exam: Up-to-date 02/2020 A1c: 9.1>5.3!!!!!!!!!!!!!!!!!!!!!  Essential hypertension/Morbid obesity (HCC)/HLD - Stable.  Looks great.  If he continues to lose weight we may be able to decrease some of his blood pressure medicines also on next visit - continue amlodipine to 7.5 mg (1.5 tabs) - continue lisinopril to 40 mg QD.    -He was counseled on low-sodium diet, routine exercise and routine follow-up. - Continue atorvastatin  BPH:  - stable - continue  flomax   Hyperpigmented skin lesion: Patient feels skin lesion is enlarging on his right cheek. Refer to  dermatology biopsy   Reviewed expectations re: course of current medical issues.  Discussed self-management of symptoms.  Outlined signs and symptoms indicating need for more acute intervention.  Patient verbalized understanding and all questions were answered.  Patient received an After-Visit Summary.    Orders Placed This Encounter  Procedures  . POCT HgB A1C   Meds ordered this encounter  Medications  . amLODipine (NORVASC) 5 MG tablet    Sig: Take 1.5 tablets (7.5 mg total) by mouth daily.    Dispense:  135 tablet    Refill:  1  . atorvastatin (LIPITOR) 40 MG tablet    Sig: Take 1 tablet (40 mg total) by mouth daily.    Dispense:  90 tablet    Refill:  1  . lisinopril (ZESTRIL) 40 MG tablet    Sig: Take 1 tablet (40 mg total) by mouth daily.    Dispense:  90 tablet    Refill:  1  . tamsulosin (FLOMAX) 0.4 MG CAPS capsule    Sig: Take 1 capsule (0.4 mg total) by mouth daily.    Dispense:  90 capsule    Refill:  1  . metFORMIN (GLUCOPHAGE) 1000 MG tablet    Sig: Take 1 tablet (1,000 mg total) by mouth 2 (two) times daily with a meal.    Dispense:  180 tablet    Refill:  1   Referral Orders  No referral(s) requested today     Note is dictated utilizing  voice recognition software. Although note has been proof read prior to signing, occasional typographical errors still can be missed. If any questions arise, please do not hesitate to call for verification.   electronically signed by:  Howard Pouch, DO  [place]

## 2020-08-04 NOTE — Patient Instructions (Signed)
Diabetes Mellitus and Foot Care Foot care is an important part of your health, especially when you have diabetes. Diabetes may cause you to have problems because of poor blood flow (circulation) to your feet and legs, which can cause your skin to:  Become thinner and drier.  Break more easily.  Heal more slowly.  Peel and crack. You may also have nerve damage (neuropathy) in your legs and feet, causing decreased feeling in them. This means that you may not notice minor injuries to your feet that could lead to more serious problems. Noticing and addressing any potential problems early is the best way to prevent future foot problems. How to care for your feet Foot hygiene  Wash your feet daily with warm water and mild soap. Do not use hot water. Then, pat your feet and the areas between your toes until they are completely dry. Do not soak your feet as this can dry your skin.  Trim your toenails straight across. Do not dig under them or around the cuticle. File the edges of your nails with an emery board or nail file.  Apply a moisturizing lotion or petroleum jelly to the skin on your feet and to dry, brittle toenails. Use lotion that does not contain alcohol and is unscented. Do not apply lotion between your toes.   Shoes and socks  Wear clean socks or stockings every day. Make sure they are not too tight. Do not wear knee-high stockings since they may decrease blood flow to your legs.  Wear shoes that fit properly and have enough cushioning. Always look in your shoes before you put them on to be sure there are no objects inside.  To break in new shoes, wear them for just a few hours a day. This prevents injuries on your feet. Wounds, scrapes, corns, and calluses  Check your feet daily for blisters, cuts, bruises, sores, and redness. If you cannot see the bottom of your feet, use a mirror or ask someone for help.  Do not cut corns or calluses or try to remove them with medicine.  If you  find a minor scrape, cut, or break in the skin on your feet, keep it and the skin around it clean and dry. You may clean these areas with mild soap and water. Do not clean the area with peroxide, alcohol, or iodine.  If you have a wound, scrape, corn, or callus on your foot, look at it several times a day to make sure it is healing and not infected. Check for: ? Redness, swelling, or pain. ? Fluid or blood. ? Warmth. ? Pus or a bad smell.   General tips  Do not cross your legs. This may decrease blood flow to your feet.  Do not use heating pads or hot water bottles on your feet. They may burn your skin. If you have lost feeling in your feet or legs, you may not know this is happening until it is too late.  Protect your feet from hot and cold by wearing shoes, such as at the beach or on hot pavement.  Schedule a complete foot exam at least once a year (annually) or more often if you have foot problems. Report any cuts, sores, or bruises to your health care provider immediately. Where to find more information  American Diabetes Association: www.diabetes.org  Association of Diabetes Care & Education Specialists: www.diabeteseducator.org Contact a health care provider if:  You have a medical condition that increases your risk of infection and   you have any cuts, sores, or bruises on your feet.  You have an injury that is not healing.  You have redness on your legs or feet.  You feel burning or tingling in your legs or feet.  You have pain or cramps in your legs and feet.  Your legs or feet are numb.  Your feet always feel cold.  You have pain around any toenails. Get help right away if:  You have a wound, scrape, corn, or callus on your foot and: ? You have pain, swelling, or redness that gets worse. ? You have fluid or blood coming from the wound, scrape, corn, or callus. ? Your wound, scrape, corn, or callus feels warm to the touch. ? You have pus or a bad smell coming from  the wound, scrape, corn, or callus. ? You have a fever. ? You have a red line going up your leg. Summary  Check your feet every day for blisters, cuts, bruises, sores, and redness.  Apply a moisturizing lotion or petroleum jelly to the skin on your feet and to dry, brittle toenails.  Wear shoes that fit properly and have enough cushioning.  If you have foot problems, report any cuts, sores, or bruises to your health care provider immediately.  Schedule a complete foot exam at least once a year (annually) or more often if you have foot problems. This information is not intended to replace advice given to you by your health care provider. Make sure you discuss any questions you have with your health care provider. Document Revised: 09/19/2019 Document Reviewed: 09/19/2019 Elsevier Patient Education  2021 Elsevier Inc.  

## 2020-08-04 NOTE — Addendum Note (Signed)
Addended by: Howard Pouch A on: 08/04/2020 04:17 PM   Modules accepted: Orders

## 2020-09-15 ENCOUNTER — Ambulatory Visit: Payer: BC Managed Care – PPO | Admitting: Family Medicine

## 2020-10-28 ENCOUNTER — Ambulatory Visit: Payer: BC Managed Care – PPO | Admitting: Family Medicine

## 2020-11-30 ENCOUNTER — Ambulatory Visit: Payer: BC Managed Care – PPO | Admitting: Family Medicine

## 2020-11-30 DIAGNOSIS — E785 Hyperlipidemia, unspecified: Secondary | ICD-10-CM

## 2020-11-30 DIAGNOSIS — R39198 Other difficulties with micturition: Secondary | ICD-10-CM

## 2020-12-16 ENCOUNTER — Ambulatory Visit: Payer: BC Managed Care – PPO | Admitting: Family Medicine

## 2020-12-30 ENCOUNTER — Ambulatory Visit: Payer: BC Managed Care – PPO | Admitting: Family Medicine

## 2020-12-30 ENCOUNTER — Encounter: Payer: Self-pay | Admitting: Family Medicine

## 2020-12-30 ENCOUNTER — Other Ambulatory Visit: Payer: Self-pay

## 2020-12-30 VITALS — BP 129/80 | HR 89 | Temp 98.3°F | Ht 75.0 in | Wt 285.0 lb

## 2020-12-30 DIAGNOSIS — E1169 Type 2 diabetes mellitus with other specified complication: Secondary | ICD-10-CM

## 2020-12-30 DIAGNOSIS — E785 Hyperlipidemia, unspecified: Secondary | ICD-10-CM | POA: Diagnosis not present

## 2020-12-30 DIAGNOSIS — R39198 Other difficulties with micturition: Secondary | ICD-10-CM

## 2020-12-30 DIAGNOSIS — I10 Essential (primary) hypertension: Secondary | ICD-10-CM

## 2020-12-30 DIAGNOSIS — Z23 Encounter for immunization: Secondary | ICD-10-CM | POA: Diagnosis not present

## 2020-12-30 DIAGNOSIS — N401 Enlarged prostate with lower urinary tract symptoms: Secondary | ICD-10-CM

## 2020-12-30 DIAGNOSIS — R351 Nocturia: Secondary | ICD-10-CM

## 2020-12-30 LAB — POCT GLYCOSYLATED HEMOGLOBIN (HGB A1C)
HbA1c POC (<> result, manual entry): 6 % (ref 4.0–5.6)
HbA1c, POC (controlled diabetic range): 6 % (ref 0.0–7.0)
HbA1c, POC (prediabetic range): 6 % (ref 5.7–6.4)
Hemoglobin A1C: 6 % — AB (ref 4.0–5.6)

## 2020-12-30 MED ORDER — TAMSULOSIN HCL 0.4 MG PO CAPS: 0.4000 mg | ORAL_CAPSULE | Freq: Every day | ORAL | 1 refills | Status: DC

## 2020-12-30 MED ORDER — AMLODIPINE BESYLATE 5 MG PO TABS: 7.5000 mg | ORAL_TABLET | Freq: Every day | ORAL | 1 refills | Status: DC

## 2020-12-30 MED ORDER — METFORMIN HCL 1000 MG PO TABS: 1000.0000 mg | ORAL_TABLET | Freq: Every day | ORAL | 1 refills | Status: DC

## 2020-12-30 MED ORDER — LISINOPRIL 40 MG PO TABS: 40.0000 mg | ORAL_TABLET | Freq: Every day | ORAL | 1 refills | Status: DC

## 2020-12-30 MED ORDER — ATORVASTATIN CALCIUM 40 MG PO TABS: 40.0000 mg | ORAL_TABLET | Freq: Every day | ORAL | 1 refills | Status: DC

## 2020-12-30 NOTE — Progress Notes (Signed)
Ryan Fisher , Aug 29, 1970, 50 y.o., male MRN: [id number] Patient Care Team    Relationship Specialty Notifications Start End  Ryan Hillock, DO PCP - General Family Medicine  08/30/16   Ryan Fisher, [place]  Optometry  08/04/20     Chief Complaint  Patient presents with   Diabetes    [place]; pt is not fasting     Subjective: Pt presents for an OV for diabetes diabetes type 2/morbid obesity: Diagnosed 04/06/2020 Pt reports compliance  with metformin 1000 mg twice daily. . Patient denies dizziness, hyperglycemic or hypoglycemic events. Patient denies numbness, tingling in the extremities or nonhealing wounds of feet.   Hypertension/HLD/Obesity:  Pt reports compliance with lisinopril 40 mg daily and amlodipine 7.5 mg daily. Blood pressures ranges at home are WNL.Patient denies chest pain, shortness of breath, dizziness or lower extremity edema.  Pt does not take a daily baby ASA. Pt is  taking his statin daily.  Labs due Diet: low sodium Exercise: not routinely RF: HTN, obesity, Fhx HD.  Daily smoker.    BPH: Pt reports his condition is stable with flomax use.    Depression screen [facility] 2/9 12/30/2020 04/01/2020 09/06/2016 08/30/2016  Decreased Interest 0 0 0 0  Down, Depressed, Hopeless 0 0 0 0  PHQ - 2 Score 0 0 0 0    Allergies  Allergen Reactions   Morphine And Related Other (See Comments)    Morphine only- sweating, flushing.    Social History   Social History Narrative   Married to [employer].    Some college attendance. 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    Drinks caffeine.    Wears a seatbelt and bicycle helmet.    Smoke detector in the home. Firearms locked in the home.    Feels safe in his relationships.    Past Medical History:  Diagnosis Date   Hyperlipidemia    Hypertension    Past Surgical History:  Procedure Laterality Date   ROTATOR CUFF REPAIR Left 2012   left   Family History  Problem Relation Age of Onset   Arthritis Mother    Atrial fibrillation  Mother    Allergies Brother    Asthma Brother    Heart failure Paternal Grandfather    Brain cancer Maternal Aunt    Early death Paternal Uncle    Allergies as of 12/30/2020       Reactions   Morphine And Related Other (See Comments)   Morphine only- sweating, flushing.         Medication List        Accurate as of December 30, 2020  2:49 PM. If you have any questions, ask your nurse or doctor.          amLODipine 5 MG tablet Commonly known as: NORVASC Take 1.5 tablets (7.5 mg total) by mouth daily.   atorvastatin 40 MG tablet Commonly known as: LIPITOR Take 1 tablet (40 mg total) by mouth daily.   lisinopril 40 MG tablet Commonly known as: ZESTRIL Take 1 tablet (40 mg total) by mouth daily.   metFORMIN 1000 MG tablet Commonly known as: GLUCOPHAGE Take 1 tablet (1,000 mg total) by mouth 2 (two) times daily with a meal.   tamsulosin 0.4 MG Caps capsule Commonly known as: FLOMAX Take 1 capsule (0.4 mg total) by mouth daily.        All past medical history, surgical history, allergies, family history, immunizations andmedications were updated in the EMR today and reviewed  under the history and medication portions of their EMR.     ROS: Negative, with the exception of above mentioned in HPI   Objective:  BP 129/80   Pulse 89   Temp 98.3 F (36.8 C) (Oral)   Ht 6\' 3"  (1.905 m)   Wt 285 lb (129.3 kg)   SpO2 96%   BMI 35.62 kg/m  Body mass index is 35.62 kg/m. Gen: Afebrile. No acute distress.  HENT: AT. [place]. No cough or hoarseness.  Eyes:Pupils Equal Round Reactive to light, Extraocular movements intact,  Conjunctiva without redness, discharge or icterus. CV: RRR no murmur, no edema, +2/4 P posterior tibialis pulses Chest: CTAB, no wheeze or crackles Skin: no rashes, purpura or petechiae.  Neuro: Normal gait. PERLA. EOMi. Alert. Oriented x3 Psych: Normal affect, dress and demeanor. Normal speech. Normal thought content and judgment.    No results  found. No results found. No results found for this or any previous visit (from the past 24 hour(s)).   Assessment/Plan: Ryan Fisher is a 50 y.o. male present for OV for  type 2 diabetes mellitus ([place]) with HLD -Congratulated him.  He is doing excellent.  He has lost 24 pounds! -Continue metformin 1000 mg daily - continue statin -DC Januvia 100 mg daily- doing great.  -Goal A1c <7, closer to 6.5 would be best -Patient was instructed on normal glucose range and glucose testing. -Patient was educated on diabetic diet and glycemic index goals. PNA series: Completed 09/06/2016 Flu shot: UTD 12/30/2020 (recommneded yearly) Foot exam: Completed 08/04/2020 Eye exam: Up-to-date 02/2020 A1c: 9.1>5.3> 6.0 !!!!!!!!!!!!!!!!!!!!!  Essential hypertension/Morbid obesity (HCC)/HLD - stable.  - continue  amlodipine to 7.5 mg (1.5 tabs) - continue lisinopril to 40 mg QD.    -He was counseled on low-sodium diet, routine exercise and routine follow-up. - continue atorvastatin   BPH:  - stable - continue  flomax    Reviewed expectations re: course of current medical issues. Discussed self-management of symptoms. Outlined signs and symptoms indicating need for more acute intervention. Patient verbalized understanding and all questions were answered. Patient received an After-Visit Summary.    No orders of the defined types were placed in this encounter.  No orders of the defined types were placed in this encounter.  Referral Orders  No referral(s) requested today     Note is dictated utilizing voice recognition software. Although note has been proof read prior to signing, occasional typographical errors still can be missed. If any questions arise, please do not hesitate to call for verification.   electronically signed by:  Howard Pouch, DO  [place]

## 2020-12-30 NOTE — Patient Instructions (Signed)
  Great to see you today.  I have refilled the medication(s) we provide.   If labs were collected, we will inform you of lab results once received either by echart message or telephone call.   - echart message- for normal results that have been seen by the patient already.   - telephone call: abnormal results or if patient has not viewed results in their echart.   Next appt end of February - schedule as your physical and come fasting, all labs collected that day

## 2021-01-27 ENCOUNTER — Other Ambulatory Visit: Payer: Self-pay

## 2021-01-27 ENCOUNTER — Ambulatory Visit: Payer: BC Managed Care – PPO | Admitting: Physician Assistant

## 2021-01-27 ENCOUNTER — Encounter: Payer: Self-pay | Admitting: Physician Assistant

## 2021-01-27 DIAGNOSIS — Z1283 Encounter for screening for malignant neoplasm of skin: Secondary | ICD-10-CM

## 2021-01-27 DIAGNOSIS — L821 Other seborrheic keratosis: Secondary | ICD-10-CM | POA: Diagnosis not present

## 2021-01-27 NOTE — Patient Instructions (Signed)
Seborrheic Keratosis A seborrheic keratosis is a common, noncancerous (benign) skin growth. These growths are velvety, waxy, rough, tan, brown, or black spots that appear on the skin. These skin growths can be flat or raised, andscaly. What are the causes? The cause of this condition is not known. What increases the risk? You are more likely to develop this condition if you: Have a family history of seborrheic keratosis. Are 50 or older. Are pregnant. Have had estrogen replacement therapy. What are the signs or symptoms? Symptoms of this condition include growths on the face, chest, shoulders, back, or other areas. These growths: Are usually painless, but may become irritated and itchy. Can be yellow, brown, black, or other colors. Are slightly raised or have a flat surface. Are sometimes rough or wart-like in texture. Are often velvety or waxy on the surface. Are round or oval-shaped. Often occur in groups, but may occur as a single growth. How is this diagnosed? This condition is diagnosed with a medical history and physical exam. A sample of the growth may be tested (skin biopsy). You may need to see a skin specialist (dermatologist). How is this treated? Treatment is not usually needed for this condition, unless the growths are irritated or bleed often. You may also choose to have the growths removed if you do not like their appearance. Most commonly, these growths are treated with a procedure in which liquid nitrogen is applied to "freeze" off the growth (cryosurgery). They may also be burned off with electricity (electrocautery) or removed by scraping (curettage). Follow these instructions at home: Watch your growth for any changes. Keep all follow-up visits as told by your health care provider. This is important. Do not scratch or pick at the growth or growths. This can cause them to become irritated or infected. Contact a health care provider if: You suddenly have many new  growths. Your growth bleeds, itches, or hurts. Your growth suddenly becomes larger or changes color. Summary A seborrheic keratosis is a common, noncancerous (benign) skin growth. Treatment is not usually needed for this condition, unless the growths are irritated or bleed often. Watch your growth for any changes. Contact a health care provider if you suddenly have many new growths or your growth suddenly becomes larger or changes color. Keep all follow-up visits as told by your health care provider. This is important. This information is not intended to replace advice given to you by your health care provider. Make sure you discuss any questions you have with your healthcare provider. Document Revised: 07/13/2017 Document Reviewed: 07/13/2017 Elsevier Patient Education  2022 Elsevier Inc.  

## 2021-01-27 NOTE — Progress Notes (Signed)
   New Patient   Subjective  Ryan Fisher is a 50 y.o. male who presents for the following: New Patient (Initial Visit) (Patient here today for lesion on his right inner cheek x 2 years per patient it's growing, no bleeding the lesion is getting darker. No personal history or family history of atypical moles, melanoma or non mole skin cancer. ).   The following portions of the chart were reviewed this encounter and updated as appropriate:  Tobacco  Allergies  Meds  Problems  Med Hx  Surg Hx  Fam Hx      Objective  Well appearing patient in no apparent distress; mood and affect are within normal limits.  All skin waist up examined.  Right Malar Cheek Dark, scaly plaque.   Waist up No atypical nevi No signs of non-mole skin cancer.    Assessment & Plan  Seborrheic keratosis Right Malar Cheek  Ok to leave if stable  Encounter for screening for malignant neoplasm of skin Waist up  Skin exams      I, Cheyanne Lamison, PA-C, have reviewed all documentation's for this visit.  The documentation on 01/27/21 for the exam, diagnosis, procedures and orders are all accurate and complete.

## 2021-03-12 LAB — HM DIABETES EYE EXAM

## 2021-05-04 ENCOUNTER — Other Ambulatory Visit: Payer: Self-pay

## 2021-05-04 ENCOUNTER — Encounter: Payer: Self-pay | Admitting: Family Medicine

## 2021-05-04 ENCOUNTER — Ambulatory Visit (INDEPENDENT_AMBULATORY_CARE_PROVIDER_SITE_OTHER): Payer: BC Managed Care – PPO | Admitting: Family Medicine

## 2021-05-04 VITALS — BP 149/89 | HR 76 | Temp 98.4°F | Ht 74.5 in | Wt 280.0 lb

## 2021-05-04 DIAGNOSIS — Z23 Encounter for immunization: Secondary | ICD-10-CM

## 2021-05-04 DIAGNOSIS — F17210 Nicotine dependence, cigarettes, uncomplicated: Secondary | ICD-10-CM | POA: Diagnosis not present

## 2021-05-04 DIAGNOSIS — N401 Enlarged prostate with lower urinary tract symptoms: Secondary | ICD-10-CM | POA: Diagnosis not present

## 2021-05-04 DIAGNOSIS — E66812 Obesity, class 2: Secondary | ICD-10-CM

## 2021-05-04 DIAGNOSIS — R351 Nocturia: Secondary | ICD-10-CM

## 2021-05-04 DIAGNOSIS — E1169 Type 2 diabetes mellitus with other specified complication: Secondary | ICD-10-CM

## 2021-05-04 DIAGNOSIS — Z0000 Encounter for general adult medical examination without abnormal findings: Secondary | ICD-10-CM

## 2021-05-04 DIAGNOSIS — E669 Obesity, unspecified: Secondary | ICD-10-CM

## 2021-05-04 DIAGNOSIS — Z1159 Encounter for screening for other viral diseases: Secondary | ICD-10-CM | POA: Diagnosis not present

## 2021-05-04 DIAGNOSIS — Z1211 Encounter for screening for malignant neoplasm of colon: Secondary | ICD-10-CM

## 2021-05-04 DIAGNOSIS — Z125 Encounter for screening for malignant neoplasm of prostate: Secondary | ICD-10-CM

## 2021-05-04 DIAGNOSIS — I10 Essential (primary) hypertension: Secondary | ICD-10-CM | POA: Diagnosis not present

## 2021-05-04 DIAGNOSIS — E785 Hyperlipidemia, unspecified: Secondary | ICD-10-CM

## 2021-05-04 DIAGNOSIS — F17200 Nicotine dependence, unspecified, uncomplicated: Secondary | ICD-10-CM

## 2021-05-04 LAB — COMPREHENSIVE METABOLIC PANEL
ALT: 26 U/L (ref 0–53)
AST: 23 U/L (ref 0–37)
Albumin: 4.6 g/dL (ref 3.5–5.2)
Alkaline Phosphatase: 69 U/L (ref 39–117)
BUN: 11 mg/dL (ref 6–23)
CO2: 29 mEq/L (ref 19–32)
Calcium: 9.9 mg/dL (ref 8.4–10.5)
Chloride: 103 mEq/L (ref 96–112)
Creatinine, Ser: 1.04 mg/dL (ref 0.40–1.50)
GFR: 84.01 mL/min (ref >60.00)
Glucose, Bld: 112 mg/dL — ABNORMAL HIGH (ref 70–99)
Potassium: 4.2 mEq/L (ref 3.5–5.1)
Sodium: 139 mEq/L (ref 135–145)
Total Bilirubin: 0.8 mg/dL (ref 0.2–1.2)
Total Protein: 7.5 g/dL (ref 6.0–8.3)

## 2021-05-04 LAB — LIPID PANEL
Cholesterol: 130 mg/dL (ref 0–200)
HDL: 40.1 mg/dL (ref >39.00)
LDL Cholesterol: 72 mg/dL (ref 0–99)
NonHDL: 89.85
Total CHOL/HDL Ratio: 3
Triglycerides: 89 mg/dL (ref 0.0–149.0)
VLDL: 17.8 mg/dL (ref 0.0–40.0)

## 2021-05-04 LAB — MICROALBUMIN / CREATININE URINE RATIO
Creatinine,U: 159.6 mg/dL
Microalb Creat Ratio: 2.8 mg/g (ref 0.0–30.0)
Microalb, Ur: 4.5 mg/dL — ABNORMAL HIGH (ref 0.0–1.9)

## 2021-05-04 LAB — CBC
HCT: 49.2 % (ref 39.0–52.0)
Hemoglobin: 16.7 g/dL (ref 13.0–17.0)
MCHC: 34 g/dL (ref 30.0–36.0)
MCV: 89.9 fl (ref 78.0–100.0)
Platelets: 268 10*3/uL (ref 150.0–400.0)
RBC: 5.47 Mil/uL (ref 4.22–5.81)
RDW: 13.7 % (ref 11.5–15.5)
WBC: 10.8 10*3/uL — ABNORMAL HIGH (ref 4.0–10.5)

## 2021-05-04 LAB — HEMOGLOBIN A1C: Hgb A1c MFr Bld: 5.9 % (ref 4.6–6.5)

## 2021-05-04 LAB — PSA: PSA: 0.91 ng/mL (ref 0.10–4.00)

## 2021-05-04 LAB — TSH: TSH: 2.72 u[IU]/mL (ref 0.35–5.50)

## 2021-05-04 MED ORDER — METFORMIN HCL 1000 MG PO TABS: 1000.0000 mg | ORAL_TABLET | Freq: Every day | ORAL | 1 refills | Status: DC

## 2021-05-04 MED ORDER — AMLODIPINE BESYLATE 10 MG PO TABS: 10.0000 mg | ORAL_TABLET | Freq: Every day | ORAL | 1 refills | Status: DC

## 2021-05-04 MED ORDER — TAMSULOSIN HCL 0.4 MG PO CAPS: 0.4000 mg | ORAL_CAPSULE | Freq: Every day | ORAL | 1 refills | Status: DC

## 2021-05-04 MED ORDER — LISINOPRIL 40 MG PO TABS: 40.0000 mg | ORAL_TABLET | Freq: Every day | ORAL | 1 refills | Status: DC

## 2021-05-04 MED ORDER — HYDROCHLOROTHIAZIDE 25 MG PO TABS: 25.0000 mg | ORAL_TABLET | Freq: Every day | ORAL | 1 refills | Status: DC

## 2021-05-04 MED ORDER — ATORVASTATIN CALCIUM 40 MG PO TABS: 40.0000 mg | ORAL_TABLET | Freq: Every day | ORAL | 1 refills | Status: DC

## 2021-05-04 NOTE — Patient Instructions (Addendum)
Added: hctz 25 mg a day and increased amlodipine to 10 mg.   Health Maintenance, Male Adopting a healthy lifestyle and getting preventive care are important in promoting health and wellness. Ask your health care provider about: The right schedule for you to have regular tests and exams. Things you can do on your own to prevent diseases and keep yourself healthy. What should I know about diet, weight, and exercise? Eat a healthy diet  Eat a diet that includes plenty of vegetables, fruits, low-fat dairy products, and lean protein. Do not eat a lot of foods that are high in solid fats, added sugars, or sodium. Maintain a healthy weight Body mass index (BMI) is a measurement that can be used to identify possible weight problems. It estimates body fat based on height and weight. Your health care provider can help determine your BMI and help you achieve or maintain a healthy weight. Get regular exercise Get regular exercise. This is one of the most important things you can do for your health. Most adults should: Exercise for at least 150 minutes each week. The exercise should increase your heart rate and make you sweat (moderate-intensity exercise). Do strengthening exercises at least twice a week. This is in addition to the moderate-intensity exercise. Spend less time sitting. Even light physical activity can be beneficial. Watch cholesterol and blood lipids Have your blood tested for lipids and cholesterol at 50 years of age, then have this test every 5 years. You may need to have your cholesterol levels checked more often if: Your lipid or cholesterol levels are high. You are older than 50 years of age. You are at high risk for heart disease. What should I know about cancer screening? Many types of cancers can be detected early and may often be prevented. Depending on your health history and family history, you may need to have cancer screening at various ages. This may include screening  for: Colorectal cancer. Prostate cancer. Skin cancer. Lung cancer. What should I know about heart disease, diabetes, and high blood pressure? Blood pressure and heart disease High blood pressure causes heart disease and increases the risk of stroke. This is more likely to develop in people who have high blood pressure readings or are overweight. Talk with your health care provider about your target blood pressure readings. Have your blood pressure checked: Every 3-5 years if you are 63-39 years of age. Every year if you are 10 years old or older. If you are between the ages of 61 and 46 and are a current or former smoker, ask your health care provider if you should have a one-time screening for abdominal aortic aneurysm (AAA). Diabetes Have regular diabetes screenings. This checks your fasting blood sugar level. Have the screening done: Once every three years after age 37 if you are at a normal weight and have a low risk for diabetes. More often and at a younger age if you are overweight or have a high risk for diabetes. What should I know about preventing infection? Hepatitis B If you have a higher risk for hepatitis B, you should be screened for this virus. Talk with your health care provider to find out if you are at risk for hepatitis B infection. Hepatitis C Blood testing is recommended for: Everyone born from [birth year] through 1964. Anyone with known risk factors for hepatitis C. Sexually transmitted infections (STIs) You should be screened each year for STIs, including gonorrhea and chlamydia, if: You are sexually active and are younger  than 50 years of age. You are older than 50 years of age and your health care provider tells you that you are at risk for this type of infection. Your sexual activity has changed since you were last screened, and you are at increased risk for chlamydia or gonorrhea. Ask your health care provider if you are at risk. Ask your health care provider about  whether you are at high risk for HIV. Your health care provider may recommend a prescription medicine to help prevent HIV infection. If you choose to take medicine to prevent HIV, you should first get tested for HIV. You should then be tested every 3 months for as long as you are taking the medicine. Follow these instructions at home: Alcohol use Do not drink alcohol if your health care provider tells you not to drink. If you drink alcohol: Limit how much you have to 0-2 drinks a day. Know how much alcohol is in your drink. In the [place] one drink equals one 12 oz bottle of beer (355 mL), one 5 oz glass of wine (148 mL), or one 1 oz glass of hard liquor (44 mL). Lifestyle Do not use any products that contain nicotine or tobacco. These products include cigarettes, chewing tobacco, and vaping devices, such as e-cigarettes. If you need help quitting, ask your health care provider. Do not use street drugs. Do not share needles. Ask your health care provider for help if you need support or information about quitting drugs. General instructions Schedule regular health, dental, and eye exams. Stay current with your vaccines. Tell your health care provider if: You often feel depressed. You have ever been abused or do not feel safe at home. Summary Adopting a healthy lifestyle and getting preventive care are important in promoting health and wellness. Follow your health care provider's instructions about healthy diet, exercising, and getting tested or screened for diseases. Follow your health care provider's instructions on monitoring your cholesterol and blood pressure. This information is not intended to replace advice given to you by your health care provider. Make sure you discuss any questions you have with your health care provider. Document Revised: 07/20/2020 Document Reviewed: 07/20/2020 Elsevier Patient Education  [employer].

## 2021-05-04 NOTE — Progress Notes (Signed)
This visit occurred during the SARS-CoV-2 public health emergency.  Safety protocols were in place, including screening questions prior to the visit, additional usage of staff PPE, and extensive cleaning of exam room while observing appropriate contact time as indicated for disinfecting solutions.    Patient ID: Ryan Fisher, male  DOB: 02/15/1971, 51 y.o.   MRN: [id number] Patient Care Team    Relationship Specialty Notifications Start End  Ma Hillock, DO PCP - General Family Medicine  08/30/16   Celestia Khat, Lasana  Optometry  08/04/20   Warren Danes, PA-C Physician Assistant Dermatology  01/27/21     Chief Complaint  Patient presents with   Annual Exam    Annual and chronic condition combined appt; pt is fasting    Subjective:  Ryan Fisher is a 52 y.o. male present for [place]. All past medical history, surgical history, allergies, family history, immunizations, medications and social history were updated in the electronic medical record today. All recent labs, ED visits and hospitalizations within the last year were reviewed.  Health maintenance:  Colonoscopy: No fhx, screen at 45> Due cologuard is ordered.  Immunizations: tdap 03/2013, Influenza (encouraged yearly), Pneumovax, covid completed. Shingrix #1 provided today Infectious disease screening: HIV completed, Hep C agreeable to testing PSA: no fhx.  > ordered PSA:  Lab Results  Component Value Date   PSA 0.94 11/29/2018  , pt was counseled on prostate cancer screenings.  Assistive device: none Oxygen [id number]:QBHA Patient has a Dental home. Hospitalizations/ED visits: none  diabetes type 2/morbid obesity: Diagnosed 04/06/2020 Pt reports compliance with metformin 1000 mg twice daily. Patient denies dizziness, hyperglycemic or hypoglycemic events. Patient denies numbness, tingling in the extremities or nonhealing wounds of feet.     Hypertension/HLD/Obesity:  Pt reports compliance  with lisinopril 40 mg daily and  amlodipine 7.5 mg daily. Blood pressures ranges at home are WNL.Patient denies chest pain, shortness of breath, dizziness or lower extremity edema.  Pt does not take a daily baby ASA. Pt is  taking his statin daily.  Labs due Diet: low sodium Exercise: not routinely RF: HTN, obesity, Fhx HD.  Daily smoker.    BPH: Pt reports his condition is stable with flomax use  Depression screen [facility] 2/9 12/30/2020 04/01/2020 09/06/2016 08/30/2016  Decreased Interest 0 0 0 0  Down, Depressed, Hopeless 0 0 0 0  PHQ - 2 Score 0 0 0 0   No flowsheet data found.      Fall Risk  09/06/2016  Falls in the past year? No    Immunization History  Administered Date(s) Administered   Influenza,inj,Quad PF,6+ Mos 11/29/2018, 12/30/2020   Influenza-Unspecified 01/11/2020   PFIZER(Purple Top)SARS-COV-2 Vaccination 06/10/2019, 07/02/2019, 02/19/2020   Pneumococcal Polysaccharide-23 09/06/2016   Tdap 03/14/2013   Zoster Recombinat (Shingrix) 05/04/2021   Past Medical History:  Diagnosis Date   Hyperlipidemia    Hypertension    Allergies  Allergen Reactions   Morphine And Related Other (See Comments)    Morphine only- sweating, flushing.    Past Surgical History:  Procedure Laterality Date   ROTATOR CUFF REPAIR Left 2012   left   Family History  Problem Relation Age of Onset   Arthritis Mother    Atrial fibrillation Mother    Osteoporosis Mother    Allergies Brother    Asthma Brother    Heart failure Paternal Grandfather    Brain cancer Maternal Aunt    Early death Paternal Uncle    Social History   Social History  Narrative   Married to [place].    Some college attendance. 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    Drinks caffeine.    Wears a seatbelt and bicycle helmet.    Smoke detector in the home. Firearms locked in the home.    Feels safe in his relationships.     Allergies as of 05/04/2021       Reactions   Morphine And Related Other (See Comments)   Morphine only- sweating,  flushing.         Medication List        Accurate as of May 04, 2021  9:25 AM. If you have any questions, ask your nurse or doctor.          amLODipine 10 MG tablet Commonly known as: NORVASC Take 1 tablet (10 mg total) by mouth daily. What changed:  medication strength how much to take Changed by: Howard Pouch, DO   atorvastatin 40 MG tablet Commonly known as: LIPITOR Take 1 tablet (40 mg total) by mouth daily.   hydrochlorothiazide 25 MG tablet Commonly known as: HYDRODIURIL Take 1 tablet (25 mg total) by mouth daily. Started by: Howard Pouch, DO   lisinopril 40 MG tablet Commonly known as: ZESTRIL Take 1 tablet (40 mg total) by mouth daily.   metFORMIN 1000 MG tablet Commonly known as: GLUCOPHAGE Take 1 tablet (1,000 mg total) by mouth daily with breakfast.   tamsulosin 0.4 MG Caps capsule Commonly known as: FLOMAX Take 1 capsule (0.4 mg total) by mouth daily.       All past medical history, surgical history, allergies, family history, immunizations andmedications were updated in the EMR today and reviewed under the history and medication portions of their EMR.     No results found for this or any previous visit (from the past 2160 hour(s)).  ROS 14 pt review of systems performed and negative (unless mentioned in an HPI)  Objective: BP (!) 149/89    Pulse 76    Temp 98.4 F (36.9 C) (Oral)    Ht 6' 2.5" (1.892 m)    Wt 280 lb (127 kg)    SpO2 99%    BMI 35.47 kg/m  Physical Exam Constitutional:      General: He is not in acute distress.    Appearance: Normal appearance. He is obese. He is not ill-appearing, toxic-appearing or diaphoretic.  HENT:     Head: Normocephalic and atraumatic.     Right Ear: Tympanic membrane, ear canal and external ear normal. There is no impacted cerumen.     Left Ear: Tympanic membrane, ear canal and external ear normal. There is no impacted cerumen.     Nose: Nose normal. No congestion or rhinorrhea.      Mouth/Throat:     Mouth: Mucous membranes are moist.     Pharynx: Oropharynx is clear. No oropharyngeal exudate or posterior oropharyngeal erythema.  Eyes:     General: No scleral icterus.       Right eye: No discharge.        Left eye: No discharge.     Extraocular Movements: Extraocular movements intact.     Pupils: Pupils are equal, round, and reactive to light.  Cardiovascular:     Rate and Rhythm: Normal rate and regular rhythm.     Pulses: Normal pulses.     Heart sounds: Normal heart sounds. No murmur heard.   No friction rub. No gallop.  Pulmonary:     Effort: Pulmonary effort is  normal. No respiratory distress.     Breath sounds: Normal breath sounds. No stridor. No wheezing, rhonchi or rales.  Chest:     Chest wall: No tenderness.  Abdominal:     General: Abdomen is flat. Bowel sounds are normal. There is no distension.     Palpations: Abdomen is soft. There is no mass.     Tenderness: There is no abdominal tenderness. There is no right CVA tenderness, left CVA tenderness, guarding or rebound.     Hernia: No hernia is present.  Musculoskeletal:        General: No swelling or tenderness. Normal range of motion.     Cervical back: Normal range of motion and neck supple.     Right lower leg: No edema.     Left lower leg: No edema.  Lymphadenopathy:     Cervical: No cervical adenopathy.  Skin:    General: Skin is warm and dry.     Coloration: Skin is not jaundiced.     Findings: No bruising, lesion or rash.  Neurological:     General: No focal deficit present.     Mental Status: He is alert and oriented to person, place, and time. Mental status is at baseline.     Cranial Nerves: No cranial nerve deficit.     Sensory: No sensory deficit.     Motor: No weakness.     Coordination: Coordination normal.     Gait: Gait normal.     Deep Tendon Reflexes: Reflexes normal.  Psychiatric:        Mood and Affect: Mood normal.        Behavior: Behavior normal.        Thought  Content: Thought content normal.        Judgment: Judgment normal.    No results found.  Assessment/plan: Jaja Switalski is a 51 y.o. male present for CPE/CMC type 2 diabetes mellitus ([place]) with HLD -continue  metformin 1000 mg daily - continue statin -DC Januvia 100 mg daily- doing great.  -Goal A1c <7, closer to 6.5 would be best -Patient was instructed on normal glucose range and glucose testing. -Patient was educated on diabetic diet and glycemic index goals. PNA series: Completed 09/06/2016 Flu shot: UTD 12/30/2020 (recommneded yearly) Foot exam: Completed 05/04/2021 Eye exam: Up-to-date 02/2021 A1c: 9.1>5.3> 6.0> collected today !!!!!!!!!!!!!!!!!!!!! 4 mos f/u  - Hemoglobin A1c - TSH - Urine Microalbumin w/creat. Ratio  Essential hypertension/Morbid obesity (HCC)/HLD/Obesity, Class II, BMI 35-39.9 - above goal. - increase  amlodipine to 10 mg - continue lisinopril to 40 mg QD.  - added HCTZ 25 mg qd   -He was counseled on low-sodium diet, routine exercise and routine follow-up. - continue atorvastatin - CBC, CMP, TSH, Lipid collected today - 4 mos. F/u  BPH:  Stable Continue  flomax   Prostate cancer screening - PSA Encounter for hepatitis C screening test for low risk patient - Hepatitis C Antibody Need for zoster vaccination - Varicella-zoster vaccine IM Colon cancer screening - Cologuard  Tobacco use disorder Patient was asked about smoking history. They were advised  to quit smoking in a clear, strong and personalized manner. Their willingness to quit smoking was assessed and felt to be in maintenance/relapse phase. He has quit in the past and reports he already started to cut back. 4 minutes were spent today in counseling.  Pt advised to follow up 22mos- he will quit by this next appt. ..  Routine general medical examination at a health care  facility Colonoscopy: No fhx, screen at 45> Due cologuard is ordered.  Immunizations: tdap 03/2013, Influenza  (encouraged yearly), Pneumovax, covid completed. Shingrix #1 provided today Infectious disease screening: HIV completed, Hep C agreeable to testing PSA: no fhx.  > ordered Patient was encouraged to exercise greater than 150 minutes a week. Patient was encouraged to choose a diet filled with fresh fruits and vegetables, and lean meats. AVS provided to patient today for education/recommendation on gender specific health and safety maintenance. Return in about 15 weeks (around 08/17/2021) for CMC (30 min).  Orders Placed This Encounter  Procedures   Varicella-zoster vaccine IM   CBC   Comprehensive metabolic panel   Hemoglobin A1c   Lipid panel   TSH   PSA   Urine Microalbumin w/creat. ratio   Hepatitis C Antibody   Cologuard   Orders Placed This Encounter  Procedures   Varicella-zoster vaccine IM   CBC   Comprehensive metabolic panel   Hemoglobin A1c   Lipid panel   TSH   PSA   Urine Microalbumin w/creat. ratio   Hepatitis C Antibody   Cologuard   Meds ordered this encounter  Medications   amLODipine (NORVASC) 10 MG tablet    Sig: Take 1 tablet (10 mg total) by mouth daily.    Dispense:  90 tablet    Refill:  1   atorvastatin (LIPITOR) 40 MG tablet    Sig: Take 1 tablet (40 mg total) by mouth daily.    Dispense:  90 tablet    Refill:  1   lisinopril (ZESTRIL) 40 MG tablet    Sig: Take 1 tablet (40 mg total) by mouth daily.    Dispense:  90 tablet    Refill:  1   metFORMIN (GLUCOPHAGE) 1000 MG tablet    Sig: Take 1 tablet (1,000 mg total) by mouth daily with breakfast.    Dispense:  90 tablet    Refill:  1   tamsulosin (FLOMAX) 0.4 MG CAPS capsule    Sig: Take 1 capsule (0.4 mg total) by mouth daily.    Dispense:  90 capsule    Refill:  1   hydrochlorothiazide (HYDRODIURIL) 25 MG tablet    Sig: Take 1 tablet (25 mg total) by mouth daily.    Dispense:  90 tablet    Refill:  1   Referral Orders  No referral(s) requested today     Note is dictated utilizing  voice recognition software. Although note has been proof read prior to signing, occasional typographical errors still can be missed. If any questions arise, please do not hesitate to call for verification.  Electronically signed by: Howard Pouch, DO [place]

## 2021-05-05 LAB — HEPATITIS C ANTIBODY
Hepatitis C Ab: NONREACTIVE
SIGNAL TO CUT-OFF: <0.02 (ref <1.00)

## 2021-06-01 DIAGNOSIS — Z1211 Encounter for screening for malignant neoplasm of colon: Secondary | ICD-10-CM | POA: Diagnosis not present

## 2021-06-10 LAB — COLOGUARD: COLOGUARD: POSITIVE — AB

## 2021-06-11 ENCOUNTER — Telehealth: Payer: Self-pay | Admitting: Family Medicine

## 2021-06-11 DIAGNOSIS — R195 Other fecal abnormalities: Secondary | ICD-10-CM | POA: Insufficient documentation

## 2021-06-11 NOTE — Telephone Encounter (Signed)
Spoke with patient regarding results/recommendations.  

## 2021-06-11 NOTE — Telephone Encounter (Signed)
Please inform patient of positive cologuard testing. ?A positive result does not necessarily mean cancer. It means that Cologuard detected DNA and/or  biomarkers in the stool which are associated with colon cancer or precancer. Patients with a positive result should have a diagnostic colonoscopy. Not investigating further now, could result in missed opportunity to diagnose and treat colon cancer or precancer. ? ? ?I have placed a referral to GI. They should be calling him to get him scheduled for evaluation and colonoscopy discussion for positive Cologuard ?

## 2021-06-11 NOTE — Telephone Encounter (Signed)
Attempted to contact pt regarding results. Unable to LVM, VM full. ?

## 2021-06-29 ENCOUNTER — Ambulatory Visit (AMBULATORY_SURGERY_CENTER): Payer: BC Managed Care – PPO | Admitting: *Deleted

## 2021-06-29 VITALS — Ht 74.0 in | Wt 282.0 lb

## 2021-06-29 DIAGNOSIS — R195 Other fecal abnormalities: Secondary | ICD-10-CM

## 2021-06-29 MED ORDER — NA SULFATE-K SULFATE-MG SULF 17.5-3.13-1.6 GM/177ML PO SOLN: 2.0000 | Freq: Once | ORAL | 0 refills | Status: AC

## 2021-06-29 NOTE — Progress Notes (Signed)
No egg or soy allergy known to patient  ?No issues known to pt with past sedation with any surgeries or procedures ?Patient denies ever being told they had issues or difficulty with intubation  ?No FH of Malignant Hyperthermia ?Pt is not on diet pills ?Pt is not on  home 02  ?Pt is not on blood thinners  ?Pt denies issues with constipation  ?No A fib or A flutter ? ? ?PV completed over the phone. Pt verified name, DOB, address and insurance during PV today.  ? ?Pt encouraged to call with questions or issues.  ?If pt has My chart, procedure instructions sent via My Chart   ?

## 2021-07-05 ENCOUNTER — Encounter: Payer: Self-pay | Admitting: Gastroenterology

## 2021-07-14 ENCOUNTER — Encounter: Payer: Self-pay | Admitting: Gastroenterology

## 2021-07-14 ENCOUNTER — Ambulatory Visit (AMBULATORY_SURGERY_CENTER): Payer: BC Managed Care – PPO | Admitting: Gastroenterology

## 2021-07-14 VITALS — BP 113/70 | HR 66 | Temp 98.1°F | Resp 20 | Ht 74.5 in | Wt 282.0 lb

## 2021-07-14 DIAGNOSIS — R195 Other fecal abnormalities: Secondary | ICD-10-CM

## 2021-07-14 DIAGNOSIS — D124 Benign neoplasm of descending colon: Secondary | ICD-10-CM

## 2021-07-14 DIAGNOSIS — D122 Benign neoplasm of ascending colon: Secondary | ICD-10-CM

## 2021-07-14 DIAGNOSIS — D125 Benign neoplasm of sigmoid colon: Secondary | ICD-10-CM

## 2021-07-14 DIAGNOSIS — K635 Polyp of colon: Secondary | ICD-10-CM

## 2021-07-14 DIAGNOSIS — K640 First degree hemorrhoids: Secondary | ICD-10-CM

## 2021-07-14 DIAGNOSIS — D123 Benign neoplasm of transverse colon: Secondary | ICD-10-CM

## 2021-07-14 DIAGNOSIS — Z1211 Encounter for screening for malignant neoplasm of colon: Secondary | ICD-10-CM | POA: Diagnosis not present

## 2021-07-14 DIAGNOSIS — D128 Benign neoplasm of rectum: Secondary | ICD-10-CM

## 2021-07-14 DIAGNOSIS — K621 Rectal polyp: Secondary | ICD-10-CM

## 2021-07-14 DIAGNOSIS — K5730 Diverticulosis of large intestine without perforation or abscess without bleeding: Secondary | ICD-10-CM

## 2021-07-14 MED ORDER — SODIUM CHLORIDE 0.9 % IV SOLN: 500.0000 mL | Freq: Once | INTRAVENOUS | Status: DC

## 2021-07-14 NOTE — Patient Instructions (Signed)

## 2021-07-14 NOTE — Progress Notes (Signed)
Pt's states no medical or surgical changes since previsit or office visit. 

## 2021-07-14 NOTE — Progress Notes (Signed)
Called to room to assist during endoscopic procedure.  Patient ID and intended procedure confirmed with present staff. Received instructions for my participation in the procedure from the performing physician.  

## 2021-07-14 NOTE — Progress Notes (Signed)
Pt non-responsive, VVS, Report to RN  °

## 2021-07-14 NOTE — Op Note (Signed)
[facility] ?Patient Name: Ryan Fisher ?Procedure Date: 07/14/2021 8:21 AM ?MRN: [id number] ?Endoscopist: Gerrit Heck , MD ?Age: 51 ?Referring MD:  ?Date of Birth: [birth year] ?Gender: Male ?Account #: [id number] ?Procedure:                Colonoscopy ?Indications:              Screening for colorectal malignant neoplasm, This  ?                          is the patient's first colonoscopy ?                          Recent Positive Cologuard test. No overt bleeding  ?                          and no known FHx of colon cancer. ?Medicines:                Monitored Anesthesia Care ?Procedure:                Pre-Anesthesia Assessment: ?                          - Prior to the procedure, a History and Physical  ?                          was performed, and patient medications and  ?                          allergies were reviewed. The patient's tolerance of  ?                          previous anesthesia was also reviewed. The risks  ?                          and benefits of the procedure and the sedation  ?                          options and risks were discussed with the patient.  ?                          All questions were answered, and informed consent  ?                          was obtained. Prior Anticoagulants: The patient has  ?                          taken no previous anticoagulant or antiplatelet  ?                          agents. ASA Grade Assessment: II - A patient with  ?                          mild systemic disease. After reviewing the risks  ?  and benefits, the patient was deemed in  ?                          satisfactory condition to undergo the procedure. ?                          After obtaining informed consent, the colonoscope  ?                          was passed under direct vision. Throughout the  ?                          procedure, the patient's blood pressure, pulse, and  ?                          oxygen saturations were monitored continuously.  The  ?                          CF [id number] #[id number] was introduced through the anus  ?                          and advanced to the the terminal ileum. The  ?                          colonoscopy was performed without difficulty. The  ?                          patient tolerated the procedure well. The quality  ?                          of the bowel preparation was good. The terminal  ?                          ileum, ileocecal valve, appendiceal orifice, and  ?                          rectum were photographed. ?Scope In: 8:31:05 AM ?Scope Out: 9:00:07 AM ?Scope Withdrawal Time: 0 hours 26 minutes 49 seconds  ?Total Procedure Duration: 0 hours 29 minutes 2 seconds  ?Findings:                 The perianal and digital rectal examinations were  ?                          normal. ?                          14 sessile polyps were found in the rectum (3),  ?                          sigmoid colon (7), descending colon (1), transverse  ?                          colon (2), and ascending colon (1). The polyps were  ?  3 to 6 mm in size. These polyps were removed with a  ?                          cold snare. Resection and retrieval were complete.  ?                          Estimated blood loss was minimal. ?                          Multiple small and large-mouthed diverticula were  ?                          found in the sigmoid colon, descending colon and  ?                          ascending colon. There was no evidence of  ?                          diverticular bleeding. ?                          Non-bleeding internal hemorrhoids were found during  ?                          retroflexion. The hemorrhoids were small. ?                          The terminal ileum appeared normal. ?Complications:            No immediate complications. ?Estimated Blood Loss:     Estimated blood loss was minimal. ?Impression:               - 14 3 to 6 mm polyps in the rectum, in the sigmoid  ?                           colon, in the descending colon, in the transverse  ?                          colon and in the ascending colon, removed with a  ?                          cold snare. Resected and retrieved. ?                          - Diverticulosis in the sigmoid colon, in the  ?                          descending colon and in the ascending colon. There  ?                          was no evidence of diverticular bleeding. ?                          - Non-bleeding internal hemorrhoids. ?                          -  The examined portion of the ileum was normal. ?Recommendation:           - Patient has a contact number available for  ?                          emergencies. The signs and symptoms of potential  ?                          delayed complications were discussed with the  ?                          patient. Return to normal activities tomorrow.  ?                          Written discharge instructions were provided to the  ?                          patient. ?                          - Resume previous diet. ?                          - Continue present medications. ?                          - Await pathology results. ?                          - Repeat colonoscopy for surveillance based on  ?                          pathology results. ?                          - Return to GI office PRN. ?Gerrit Heck, MD ?07/14/2021 9:10:09 AM ?

## 2021-07-14 NOTE — Progress Notes (Signed)
? ?GASTROENTEROLOGY PROCEDURE H&P NOTE  ? ?Primary Care Physician: ?Kuneff, Renee A, DO ? ? ? ?Reason for Procedure:  Colon Cancer screening, positive Cologuard ? ?Plan:    Colonoscopy ? ?Patient is appropriate for endoscopic procedure(s) in the ambulatory ([place]) setting. ? ?The nature of the procedure, as well as the risks, benefits, and alternatives were carefully and thoroughly reviewed with the patient. Ample time for discussion and questions allowed. The patient understood, was satisfied, and agreed to proceed.  ? ? ? ?HPI: ?Ryan Fisher is a 51 y.o. male who presents for colonoscopy for routine Colon Cancer screening.  Recent Cologuard positive on 06/01/2021.  Otherwise normal CBC.  No active GI symptoms.  No known family history of colon cancer or related malignancy.  Patient is otherwise without complaints or active issues today. ? ?Past Medical History:  ?Diagnosis Date  ? Hyperlipidemia   ? Hypertension   ? ? ?Past Surgical History:  ?Procedure Laterality Date  ? COLONOSCOPY    ? ROTATOR CUFF REPAIR Left 2012  ? left  ? ? ?Prior to Admission medications   ?Medication Sig Start Date End Date Taking? Authorizing Provider  ?amLODipine (NORVASC) 10 MG tablet Take 1 tablet (10 mg total) by mouth daily. 05/04/21  Yes Kuneff, Renee A, DO  ?atorvastatin (LIPITOR) 40 MG tablet Take 1 tablet (40 mg total) by mouth daily. 05/04/21  Yes Kuneff, Renee A, DO  ?hydrochlorothiazide (HYDRODIURIL) 25 MG tablet Take 1 tablet (25 mg total) by mouth daily. 05/04/21  Yes Kuneff, Renee A, DO  ?lisinopril (ZESTRIL) 40 MG tablet Take 1 tablet (40 mg total) by mouth daily. 05/04/21  Yes Kuneff, Renee A, DO  ?metFORMIN (GLUCOPHAGE) 1000 MG tablet Take 1 tablet (1,000 mg total) by mouth daily with breakfast. 05/04/21  Yes Kuneff, Renee A, DO  ?tamsulosin (FLOMAX) 0.4 MG CAPS capsule Take 1 capsule (0.4 mg total) by mouth daily. 05/04/21  Yes Kuneff, Renee A, DO  ? ? ?Current Outpatient Medications  ?Medication Sig Dispense Refill  ?  amLODipine (NORVASC) 10 MG tablet Take 1 tablet (10 mg total) by mouth daily. 90 tablet 1  ? atorvastatin (LIPITOR) 40 MG tablet Take 1 tablet (40 mg total) by mouth daily. 90 tablet 1  ? hydrochlorothiazide (HYDRODIURIL) 25 MG tablet Take 1 tablet (25 mg total) by mouth daily. 90 tablet 1  ? lisinopril (ZESTRIL) 40 MG tablet Take 1 tablet (40 mg total) by mouth daily. 90 tablet 1  ? metFORMIN (GLUCOPHAGE) 1000 MG tablet Take 1 tablet (1,000 mg total) by mouth daily with breakfast. 90 tablet 1  ? tamsulosin (FLOMAX) 0.4 MG CAPS capsule Take 1 capsule (0.4 mg total) by mouth daily. 90 capsule 1  ? ?Current Facility-Administered Medications  ?Medication Dose Route Frequency Provider Last Rate Last Admin  ? 0.9 %  sodium chloride infusion  500 mL Intravenous Once Lisseth Brazeau V, DO      ? ? ?Allergies as of 07/14/2021 - Review Complete 07/14/2021  ?Allergen Reaction Noted  ? Morphine and related Other (See Comments)   ? ? ?Family History  ?Problem Relation Age of Onset  ? Arthritis Mother   ? Atrial fibrillation Mother   ? Osteoporosis Mother   ? Allergies Brother   ? Asthma Brother   ? Brain cancer Maternal Aunt   ? Early death Paternal Uncle   ? Heart failure Paternal Grandfather   ? Colon polyps Neg Hx   ? Colon cancer Neg Hx   ? Esophageal cancer Neg Hx   ?  Stomach cancer Neg Hx   ? Rectal cancer Neg Hx   ? ? ?Social History  ? ?Socioeconomic History  ? Marital status: Married  ?  Spouse name: Lola  ? Number of children: Not on file  ? Years of education: 33  ? Highest education level: Not on file  ?Occupational History  ? Occupation: Geophysicist/field seismologist  ?  Employer: [employer]  ?  Comment: welding, grinding exposure  ?Tobacco Use  ? Smoking status: Former  ?  Packs/day: 1.00  ?  Years: 22.00  ?  Pack years: 22.00  ?  Types: Cigarettes, Cigars  ?  Start date: 03/14/1994  ? Smokeless tobacco: Former  ?  Types: Snuff  ?  Quit date: 03/14/2009  ?Vaping Use  ? Vaping Use: Never used  ?Substance and Sexual Activity  ?  Alcohol use: Yes  ?  Alcohol/week: 6.0 standard drinks  ?  Types: 6 Cans of beer per week  ?  Comment: every couple weeks  ? Drug use: No  ?  Comment: quit at age 31  ? Sexual activity: Yes  ?  Partners: Female  ?Other Topics Concern  ? Not on file  ?Social History Narrative  ? Married to [employer].   ? Some college attendance. 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   ? Drinks caffeine.   ? Wears a seatbelt and bicycle helmet.   ? Smoke detector in the home. Firearms locked in the home.   ? Feels safe in his relationships.   ? ?Social Determinants of Health  ? ?Financial Resource Strain: Not on file  ?Food Insecurity: Not on file  ?Transportation Needs: Not on file  ?Physical Activity: Not on file  ?Stress: Not on file  ?Social Connections: Not on file  ?Intimate Partner Violence: Not on file  ? ? ?Physical Exam: ?Vital signs in last 24 hours: ?'@BP'$  (!) 152/109   Pulse 79   Temp 98.1 ?F (36.7 ?C) (Temporal)   Ht 6' 2.5" (1.892 m)   Wt 282 lb (127.9 kg)   SpO2 98%   BMI 35.72 kg/m?  ?GEN: NAD ?EYE: Sclerae anicteric ?ENT: MMM ?CV: Non-tachycardic ?Pulm: CTA b/l ?GI: Soft, NT/ND ?NEURO:  Alert & Oriented x 3 ? ? ?Gerrit Heck, DO ?[facility] ? ? ?07/14/2021 8:19 AM ? ?

## 2021-07-16 ENCOUNTER — Telehealth: Payer: Self-pay

## 2021-07-16 NOTE — Telephone Encounter (Signed)
Follow up call placed, no answer/no VM. ?SChaplin, RN,BSN ? ?

## 2021-07-16 NOTE — Telephone Encounter (Signed)
Follow up call placed, VM box is full and RN unable to leave message. ?SChaplin, RN,BSN ? ?

## 2021-07-16 NOTE — Telephone Encounter (Signed)
Patient returned call, stated he is doing well. ?

## 2021-07-16 NOTE — Telephone Encounter (Signed)
Patient returned call, stated he is doing well.  ?

## 2021-07-21 ENCOUNTER — Encounter: Payer: Self-pay | Admitting: Gastroenterology

## 2021-07-26 ENCOUNTER — Encounter: Payer: Self-pay | Admitting: Family Medicine

## 2021-07-26 ENCOUNTER — Ambulatory Visit: Payer: BC Managed Care – PPO | Admitting: Family Medicine

## 2021-07-26 VITALS — BP 111/76 | HR 91 | Temp 98.0°F | Wt 286.4 lb

## 2021-07-26 DIAGNOSIS — I10 Essential (primary) hypertension: Secondary | ICD-10-CM | POA: Diagnosis not present

## 2021-07-26 DIAGNOSIS — Z23 Encounter for immunization: Secondary | ICD-10-CM | POA: Diagnosis not present

## 2021-07-26 DIAGNOSIS — R351 Nocturia: Secondary | ICD-10-CM

## 2021-07-26 DIAGNOSIS — E1169 Type 2 diabetes mellitus with other specified complication: Secondary | ICD-10-CM | POA: Diagnosis not present

## 2021-07-26 DIAGNOSIS — E785 Hyperlipidemia, unspecified: Secondary | ICD-10-CM | POA: Diagnosis not present

## 2021-07-26 DIAGNOSIS — E669 Obesity, unspecified: Secondary | ICD-10-CM

## 2021-07-26 DIAGNOSIS — N401 Enlarged prostate with lower urinary tract symptoms: Secondary | ICD-10-CM

## 2021-07-26 LAB — POCT GLYCOSYLATED HEMOGLOBIN (HGB A1C)
HbA1c POC (<> result, manual entry): 5.9 % (ref 4.0–5.6)
HbA1c, POC (controlled diabetic range): 5.9 % (ref 0.0–7.0)
HbA1c, POC (prediabetic range): 5.9 % (ref 5.7–6.4)
Hemoglobin A1C: 5.9 % — AB (ref 4.0–5.6)

## 2021-07-26 MED ORDER — LISINOPRIL 40 MG PO TABS: 40.0000 mg | ORAL_TABLET | Freq: Every day | ORAL | 1 refills | Status: DC

## 2021-07-26 MED ORDER — METFORMIN HCL 1000 MG PO TABS: 1000.0000 mg | ORAL_TABLET | Freq: Every day | ORAL | 1 refills | Status: DC

## 2021-07-26 MED ORDER — HYDROCHLOROTHIAZIDE 25 MG PO TABS: 25.0000 mg | ORAL_TABLET | Freq: Every day | ORAL | 1 refills | Status: DC

## 2021-07-26 MED ORDER — AMLODIPINE BESYLATE 10 MG PO TABS: 10.0000 mg | ORAL_TABLET | Freq: Every day | ORAL | 1 refills | Status: DC

## 2021-07-26 MED ORDER — ATORVASTATIN CALCIUM 40 MG PO TABS: 40.0000 mg | ORAL_TABLET | Freq: Every day | ORAL | 1 refills | Status: DC

## 2021-07-26 MED ORDER — TAMSULOSIN HCL 0.4 MG PO CAPS: 0.4000 mg | ORAL_CAPSULE | Freq: Every day | ORAL | 1 refills | Status: DC

## 2021-07-26 NOTE — Progress Notes (Signed)
? ?This visit occurred during the SARS-CoV-2 public health emergency.  Safety protocols were in place, including screening questions prior to the visit, additional usage of staff PPE, and extensive cleaning of exam room while observing appropriate contact time as indicated for disinfecting solutions.  ? ? ?Patient ID: Ryan Fisher, male  DOB: May 24, 1970, 51 y.o.   MRN: [id number] ?Patient Care Team  ?  Relationship Specialty Notifications Start End  ?Ma Hillock, DO PCP - General Family Medicine  08/30/16   ?Celestia Khat, [place]  Optometry  08/04/20   ?Starlyn Skeans Physician Assistant Dermatology  01/27/21   ? ? ?Chief Complaint  ?Patient presents with  ? Diabetes  ? Hypertension  ? Hyperlipidemia  ? ? ?Subjective: ? ?Ryan Fisher is a 51 y.o. male present for [facility]. ?All past medical history, surgical history, allergies, family history, immunizations, medications and social history were updated in the electronic medical record today. ?All recent labs, ED visits and hospitalizations within the last year were reviewed. ? ?diabetes type 2/morbid obesity: Diagnosed 04/06/2020 ?Pt reports compliance with metformin 1000 mg twice daily. Patient denies dizziness, hyperglycemic or hypoglycemic events. Patient denies numbness, tingling in the extremities or nonhealing wounds of feet.  ?  ?Hypertension/HLD/Obesity:  ?Pt reports compliance with lisinopril 40 mg daily and amlodipine 10 mg daily, hctz 25 mg qd.. Blood pressures ranges at home are WNL. Patient denies chest pain, shortness of breath, dizziness or lower extremity edema.  ?  Pt does not take a daily baby ASA. Pt is  taking his statin daily.  ?Diet: low sodium ?Exercise: not routinely ?RF: HTN, obesity, Fhx HD.  Daily smoker.  ?  ?BPH: ?Pt reports his condition is stable with flomax use ? ? ?  12/30/2020  ?  2:45 PM 04/01/2020  ?  2:26 PM 09/06/2016  ? 10:34 AM 08/30/2016  ?  1:59 PM  ?Depression screen PHQ 2/9  ?Decreased Interest 0 0 0 0  ?Down, Depressed,  Hopeless 0 0 0 0  ?PHQ - 2 Score 0 0 0 0  ? ?   ? View : No data to display.  ?  ?  ?  ? ? ?  ?  ? ?  09/06/2016  ? 10:34 AM  ?Fall Risk   ?Falls in the past year? No  ? ? ?Immunization History  ?Administered Date(s) Administered  ? Influenza,inj,Quad PF,6+ Mos 11/29/2018, 12/30/2020  ? Influenza-Unspecified 01/11/2020  ? PFIZER(Purple Top)SARS-COV-2 Vaccination 06/10/2019, 07/02/2019, 02/19/2020  ? Pneumococcal Polysaccharide-23 09/06/2016  ? Tdap 03/14/2013  ? Zoster Recombinat (Shingrix) 05/04/2021, 07/26/2021  ? ?Past Medical History:  ?Diagnosis Date  ? Hyperlipidemia   ? Hypertension   ? ?Allergies  ?Allergen Reactions  ? Morphine And Related Other (See Comments)  ?  Morphine only- sweating, flushing.   ? ?Past Surgical History:  ?Procedure Laterality Date  ? COLONOSCOPY    ? ROTATOR CUFF REPAIR Left 2012  ? left  ? ?Family History  ?Problem Relation Age of Onset  ? Arthritis Mother   ? Atrial fibrillation Mother   ? Osteoporosis Mother   ? Allergies Brother   ? Asthma Brother   ? Brain cancer Maternal Aunt   ? Early death Paternal Uncle   ? Heart failure Paternal Grandfather   ? Colon polyps Neg Hx   ? Colon cancer Neg Hx   ? Esophageal cancer Neg Hx   ? Stomach cancer Neg Hx   ? Rectal cancer Neg Hx   ? ?Social History  ? ?  Social History Narrative  ? Married to [employer].   ? Some college attendance. 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   ? Drinks caffeine.   ? Wears a seatbelt and bicycle helmet.   ? Smoke detector in the home. Firearms locked in the home.   ? Feels safe in his relationships.   ? ? ?Allergies as of 07/26/2021   ? ?   Reactions  ? Morphine And Related Other (See Comments)  ? Morphine only- sweating, flushing.   ? ?  ? ?  ?Medication List  ?  ? ?  ? Accurate as of Jul 26, 2021  2:46 PM. If you have any questions, ask your nurse or doctor.  ?  ?  ? ?  ? ?amLODipine 10 MG tablet ?Commonly known as: NORVASC ?Take 1 tablet (10 mg total) by mouth daily. ?  ?atorvastatin 40 MG tablet ?Commonly known  as: LIPITOR ?Take 1 tablet (40 mg total) by mouth daily. ?  ?hydrochlorothiazide 25 MG tablet ?Commonly known as: HYDRODIURIL ?Take 1 tablet (25 mg total) by mouth daily. ?  ?lisinopril 40 MG tablet ?Commonly known as: ZESTRIL ?Take 1 tablet (40 mg total) by mouth daily. ?  ?metFORMIN 1000 MG tablet ?Commonly known as: GLUCOPHAGE ?Take 1 tablet (1,000 mg total) by mouth daily with breakfast. ?  ?tamsulosin 0.4 MG Caps capsule ?Commonly known as: FLOMAX ?Take 1 capsule (0.4 mg total) by mouth daily. ?  ? ?  ? ?All past medical history, surgical history, allergies, family history, immunizations andmedications were updated in the EMR today and reviewed under the history and medication portions of their EMR.    ? ? ?ROS ?14 pt review of systems performed and negative (unless mentioned in an HPI) ? ?Objective: ?BP 111/76   Pulse 91   Temp 98 ?F (36.7 ?C)   Wt 286 lb 6.4 oz (129.9 kg)   SpO2 97%   BMI 36.28 kg/m?  ?Physical Exam ?Constitutional:   ?   General: He is not in acute distress. ?   Appearance: Normal appearance. He is obese. He is not ill-appearing or toxic-appearing.  ?HENT:  ?   Head: Normocephalic and atraumatic.  ?Eyes:  ?   General: No scleral icterus.    ?   Right eye: No discharge.     ?   Left eye: No discharge.  ?   Extraocular Movements: Extraocular movements intact.  ?   Pupils: Pupils are equal, round, and reactive to light.  ?Cardiovascular:  ?   Rate and Rhythm: Normal rate and regular rhythm.  ?   Pulses: Normal pulses.  ?   Heart sounds: Normal heart sounds. No murmur heard. ?  No friction rub. No gallop.  ?Pulmonary:  ?   Effort: Pulmonary effort is normal. No respiratory distress.  ?   Breath sounds: Normal breath sounds. No stridor. No wheezing, rhonchi or rales.  ?Chest:  ?   Chest wall: No tenderness.  ?Musculoskeletal:  ?   Right lower leg: No edema.  ?   Left lower leg: No edema.  ?Skin: ?   General: Skin is warm and dry.  ?   Coloration: Skin is not jaundiced.  ?   Findings: No  bruising, lesion or rash.  ?Neurological:  ?   General: No focal deficit present.  ?   Mental Status: He is alert and oriented to person, place, and time. Mental status is at baseline.  ?   Cranial Nerves: No cranial nerve deficit.  ?  Sensory: No sensory deficit.  ?   Motor: No weakness.  ?   Coordination: Coordination normal.  ?   Gait: Gait normal.  ?   Deep Tendon Reflexes: Reflexes normal.  ?Psychiatric:     ?   Mood and Affect: Mood normal.     ?   Behavior: Behavior normal.     ?   Thought Content: Thought content normal.     ?   Judgment: Judgment normal.  ? ? ?No results found. ? ?Assessment/plan: ?Ryan Fisher is a 51 y.o. male present for CPE/CMC ?type 2 diabetes mellitus (HCC) with HLD ?-continue  metformin 1000 mg daily ?- continue  statin ?-DC Januvia 100 mg daily- doing great.  ?-Goal A1c <7, closer to 6.5 would be best ?-Patient was instructed on normal glucose range and glucose testing. ?-Patient was educated on diabetic diet and glycemic index goals. ?PNA series: Completed 09/06/2016 ?Flu shot: UTD 12/30/2020 (recommneded yearly) ?Foot exam: Completed 05/04/2021 ?Eye exam: Up-to-date 02/2021 ?A1c: 9.1>5.3> 6.0> 5.9 collected today !!!!!!!!!!!!!!!!!!!!! ?5.5 mos f/u ? ?Essential hypertension/Morbid obesity (HCC)/HLD/Obesity, Class II, BMI 35-39.9 ?- stable  ?-continue  amlodipine to 10 mg ?- continue lisinopril to 40 mg QD.  ?- continue HCTZ 25 mg qd ?  -He was counseled on low-sodium diet, routine exercise and routine follow-up. ?- continue atorvastatin ? ?BPH:  ?Stable ?Continue  flomax  ? ?Shingrix #2 provided today. ? ?Return in about 24 weeks (around 01/10/2022) for Routine chronic condition follow-up. ? ? ?Orders Placed This Encounter  ?Procedures  ? Varicella-zoster vaccine IM  ? POCT HgB A1C  ? ?Meds ordered this encounter  ?Medications  ? amLODipine (NORVASC) 10 MG tablet  ?  Sig: Take 1 tablet (10 mg total) by mouth daily.  ?  Dispense:  90 tablet  ?  Refill:  1  ? atorvastatin (LIPITOR) 40  MG tablet  ?  Sig: Take 1 tablet (40 mg total) by mouth daily.  ?  Dispense:  90 tablet  ?  Refill:  1  ? lisinopril (ZESTRIL) 40 MG tablet  ?  Sig: Take 1 tablet (40 mg total) by mouth daily.  ?  Dispense:  90 tablet

## 2021-07-26 NOTE — Patient Instructions (Addendum)
Return in about 24 weeks (around 01/10/2022) for Routine chronic condition follow-up. ? ? ? ? ? ? ? ?Great to see you today.  ?I have refilled the medication(s) we provide.  ? ?If labs were collected, we will inform you of lab results once received either by echart message or telephone call.  ? - echart message- for normal results that have been seen by the patient already.  ? - telephone call: abnormal results or if patient has not viewed results in their echart. ? ?

## 2021-10-25 ENCOUNTER — Encounter: Payer: Self-pay | Admitting: Family Medicine

## 2021-10-25 ENCOUNTER — Ambulatory Visit: Payer: BC Managed Care – PPO | Admitting: Family Medicine

## 2021-10-25 VITALS — BP 147/88 | HR 64 | Temp 98.2°F | Ht 74.0 in

## 2021-10-25 DIAGNOSIS — M545 Low back pain, unspecified: Secondary | ICD-10-CM

## 2021-10-25 MED ORDER — CYCLOBENZAPRINE HCL 10 MG PO TABS: 10.0000 mg | ORAL_TABLET | Freq: Three times a day (TID) | ORAL | 0 refills | Status: DC | PRN

## 2021-10-25 MED ORDER — PREDNISONE 20 MG PO TABS: ORAL_TABLET | ORAL | 0 refills | Status: DC

## 2021-10-25 NOTE — Progress Notes (Signed)
Ryan Fisher , February 26, 1971, 51 y.o., male MRN: [id number] Patient Care Team    Relationship Specialty Notifications Start End  Ma Hillock, DO PCP - General Family Medicine  08/30/16   Celestia Khat, Parnell  Optometry  08/04/20   Warren Danes, PA-C Physician Assistant Dermatology  01/27/21     Chief Complaint  Patient presents with   Back Pain    Pt c/o L severe low back pain x 2 weeks that has been rapidly worsen each day;      Subjective: Pt presents for an OV with complaints of left-sided low back pain approximately 2 weeks duration has been steadily worsening over the last few days.  He denies any radiation of pain to his lower extremities.  He states he was lifting heavy objects and pain started shortly thereafter.  He has not had any history of lower back surgeries or chronic back pain.  He states laying flat and transitioning positions will cause pain.  He denies any bowel or bladder changes.  He has tried taking anti-inflammatories.     12/30/2020    2:45 PM 04/01/2020    2:26 PM 09/06/2016   10:34 AM 08/30/2016    1:59 PM  Depression screen PHQ 2/9  Decreased Interest 0 0 0 0  Down, Depressed, Hopeless 0 0 0 0  PHQ - 2 Score 0 0 0 0    Allergies  Allergen Reactions   Morphine And Related Other (See Comments)    Morphine only- sweating, flushing.    Social History   Social History Narrative   Married to [employer].    Some college attendance. 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    Drinks caffeine.    Wears a seatbelt and bicycle helmet.    Smoke detector in the home. Firearms locked in the home.    Feels safe in his relationships.    Past Medical History:  Diagnosis Date   Hyperlipidemia    Hypertension    Past Surgical History:  Procedure Laterality Date   COLONOSCOPY     ROTATOR CUFF REPAIR Left 2012   left   Family History  Problem Relation Age of Onset   Arthritis Mother    Atrial fibrillation Mother    Osteoporosis Mother    Allergies  Brother    Asthma Brother    Brain cancer Maternal Aunt    Early death Paternal Uncle    Heart failure Paternal Grandfather    Colon polyps Neg Hx    Colon cancer Neg Hx    Esophageal cancer Neg Hx    Stomach cancer Neg Hx    Rectal cancer Neg Hx    Allergies as of 10/25/2021       Reactions   Morphine And Related Other (See Comments)   Morphine only- sweating, flushing.         Medication List        Accurate as of October 25, 2021  1:49 PM. If you have any questions, ask your nurse or doctor.          amLODipine 10 MG tablet Commonly known as: NORVASC Take 1 tablet (10 mg total) by mouth daily.   atorvastatin 40 MG tablet Commonly known as: LIPITOR Take 1 tablet (40 mg total) by mouth daily.   hydrochlorothiazide 25 MG tablet Commonly known as: HYDRODIURIL Take 1 tablet (25 mg total) by mouth daily.   lisinopril 40 MG tablet Commonly known as: ZESTRIL Take 1  tablet (40 mg total) by mouth daily.   metFORMIN 1000 MG tablet Commonly known as: GLUCOPHAGE Take 1 tablet (1,000 mg total) by mouth daily with breakfast.   tamsulosin 0.4 MG Caps capsule Commonly known as: FLOMAX Take 1 capsule (0.4 mg total) by mouth daily.        All past medical history, surgical history, allergies, family history, immunizations andmedications were updated in the EMR today and reviewed under the history and medication portions of their EMR.     ROS Negative, with the exception of above mentioned in HPI   Objective:  BP (!) 147/88   Pulse 64   Temp 98.2 F (36.8 C) (Oral)   Ht '6\' 2"'$  (1.88 m)   SpO2 97%   BMI 36.77 kg/m  Body mass index is 36.77 kg/m. Physical Exam Vitals and nursing note reviewed.  Constitutional:      General: He is not in acute distress.    Appearance: Normal appearance. He is not ill-appearing, toxic-appearing or diaphoretic.  HENT:     Head: Normocephalic and atraumatic.  Eyes:     General: No scleral icterus.       Right eye: No  discharge.        Left eye: No discharge.     Extraocular Movements: Extraocular movements intact.     Pupils: Pupils are equal, round, and reactive to light.  Musculoskeletal:     Lumbar back: Swelling and spasms present. No edema, deformity or signs of trauma. Decreased range of motion.     Comments: Mild tenderness to lumbar bony palpation.  Tenderness to left paraspinal muscle.  Discomfort with left side bending and extension.  Neurovascular intact distally  Skin:    General: Skin is warm and dry.     Coloration: Skin is not jaundiced or pale.     Findings: No rash.  Neurological:     Mental Status: He is alert and oriented to person, place, and time. Mental status is at baseline.  Psychiatric:        Mood and Affect: Mood normal.        Behavior: Behavior normal.        Thought Content: Thought content normal.        Judgment: Judgment normal.     No results found. No results found. No results found for this or any previous visit (from the past 24 hour(s)).  Assessment/Plan: Shell Yandow is a 51 y.o. male present for OV for  Lumbar pain Lower back strain. Toradol injection provided today.  He was advised not to take any additional NSAIDs throughout the day. Prednisone taper to start ASAP. Flexeril 10 mg 3 times daily as needed.  Sedation precaution discussed Work note to work from home next 5 days.  No heavy lifting. Follow-up in 1-2 weeks if pain is not resolving.  Reviewed expectations re: course of current medical issues. Discussed self-management of symptoms. Outlined signs and symptoms indicating need for more acute intervention. Patient verbalized understanding and all questions were answered. Patient received an After-Visit Summary.    No orders of the defined types were placed in this encounter.  No orders of the defined types were placed in this encounter.  Referral Orders  No referral(s) requested today     Note is dictated utilizing voice recognition  software. Although note has been proof read prior to signing, occasional typographical errors still can be missed. If any questions arise, please do not hesitate to call for verification.   electronically signed  by:  Manhattan Mccuen, DO  [facility] - [place]    

## 2021-11-08 ENCOUNTER — Ambulatory Visit: Payer: BC Managed Care – PPO | Admitting: Family Medicine

## 2021-11-08 ENCOUNTER — Encounter: Payer: Self-pay | Admitting: Family Medicine

## 2021-11-08 VITALS — BP 127/76 | HR 93 | Temp 98.3°F | Ht 74.0 in | Wt 283.0 lb

## 2021-11-08 DIAGNOSIS — J111 Influenza due to unidentified influenza virus with other respiratory manifestations: Secondary | ICD-10-CM

## 2021-11-08 DIAGNOSIS — M545 Low back pain, unspecified: Secondary | ICD-10-CM | POA: Diagnosis not present

## 2021-11-08 DIAGNOSIS — Z23 Encounter for immunization: Secondary | ICD-10-CM | POA: Diagnosis not present

## 2021-11-08 MED ORDER — MELOXICAM 15 MG PO TABS: 15.0000 mg | ORAL_TABLET | Freq: Every day | ORAL | 1 refills | Status: DC

## 2021-11-08 NOTE — Progress Notes (Unsigned)
Ryan Fisher , Mar 28, 1970, 51 y.o., male MRN: [id number] Patient Care Team    Relationship Specialty Notifications Start End  Ma Hillock, DO PCP - General Family Medicine  08/30/16   Celestia Khat, Lyle  Optometry  08/04/20   Warren Danes, PA-C Physician Assistant Dermatology  01/27/21     Chief Complaint  Patient presents with   Back Pain    Pt states sx are improving but feels the low back pain the radiating to L hip     Subjective: Pt presents for an OV with complaints of left-sided low back pain approximately 2 weeks duration has been steadily worsening over the last few days.  He denies any radiation of pain to his lower extremities.  He states he was lifting heavy objects and pain started shortly thereafter.  He has not had any history of lower back surgeries or chronic back pain.  He states laying flat and transitioning positions will cause pain.  He denies any bowel or bladder changes.  He has tried taking anti-inflammatories.     12/30/2020    2:45 PM 04/01/2020    2:26 PM 09/06/2016   10:34 AM 08/30/2016    1:59 PM  Depression screen PHQ 2/9  Decreased Interest 0 0 0 0  Down, Depressed, Hopeless 0 0 0 0  PHQ - 2 Score 0 0 0 0    Allergies  Allergen Reactions   Morphine And Related Other (See Comments)    Morphine only- sweating, flushing.    Social History   Social History Narrative   Married to [employer].    Some college attendance. 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    Drinks caffeine.    Wears a seatbelt and bicycle helmet.    Smoke detector in the home. Firearms locked in the home.    Feels safe in his relationships.    Past Medical History:  Diagnosis Date   Hyperlipidemia    Hypertension    Past Surgical History:  Procedure Laterality Date   COLONOSCOPY     ROTATOR CUFF REPAIR Left 2012   left   Family History  Problem Relation Age of Onset   Arthritis Mother    Atrial fibrillation Mother    Osteoporosis Mother    Allergies  Brother    Asthma Brother    Brain cancer Maternal Aunt    Early death Paternal Uncle    Heart failure Paternal Grandfather    Colon polyps Neg Hx    Colon cancer Neg Hx    Esophageal cancer Neg Hx    Stomach cancer Neg Hx    Rectal cancer Neg Hx    Allergies as of 11/08/2021       Reactions   Morphine And Related Other (See Comments)   Morphine only- sweating, flushing.         Medication List        Accurate as of November 08, 2021  3:55 PM. If you have any questions, ask your nurse or doctor.          amLODipine 10 MG tablet Commonly known as: NORVASC Take 1 tablet (10 mg total) by mouth daily.   atorvastatin 40 MG tablet Commonly known as: LIPITOR Take 1 tablet (40 mg total) by mouth daily.   cyclobenzaprine 10 MG tablet Commonly known as: FLEXERIL Take 1 tablet (10 mg total) by mouth 3 (three) times daily as needed for muscle spasms.   hydrochlorothiazide 25 MG tablet  Commonly known as: HYDRODIURIL Take 1 tablet (25 mg total) by mouth daily.   lisinopril 40 MG tablet Commonly known as: ZESTRIL Take 1 tablet (40 mg total) by mouth daily.   metFORMIN 1000 MG tablet Commonly known as: GLUCOPHAGE Take 1 tablet (1,000 mg total) by mouth daily with breakfast.   predniSONE 20 MG tablet Commonly known as: DELTASONE 60 mg x3d, 40 mg x3d, 20 mg x2d, 10 mg x2d   tamsulosin 0.4 MG Caps capsule Commonly known as: FLOMAX Take 1 capsule (0.4 mg total) by mouth daily.        All past medical history, surgical history, allergies, family history, immunizations andmedications were updated in the EMR today and reviewed under the history and medication portions of their EMR.     ROS Negative, with the exception of above mentioned in HPI   Objective:  BP 127/76   Pulse 93   Temp 98.3 F (36.8 C) (Oral)   Ht '6\' 2"'$  (1.88 m)   Wt 283 lb (128.4 kg)   SpO2 97%   BMI 36.34 kg/m  Body mass index is 36.34 kg/m. Physical Exam Vitals and nursing note reviewed.   Constitutional:      General: He is not in acute distress.    Appearance: Normal appearance. He is not ill-appearing, toxic-appearing or diaphoretic.  HENT:     Head: Normocephalic and atraumatic.  Eyes:     General: No scleral icterus.       Right eye: No discharge.        Left eye: No discharge.     Extraocular Movements: Extraocular movements intact.     Pupils: Pupils are equal, round, and reactive to light.  Musculoskeletal:     Lumbar back: Swelling and spasms present. No edema, deformity or signs of trauma. Decreased range of motion.     Comments: Mild tenderness to lumbar bony palpation.  Tenderness to left paraspinal muscle.  Discomfort with left side bending and extension.  Neurovascular intact distally  Skin:    General: Skin is warm and dry.     Coloration: Skin is not jaundiced or pale.     Findings: No rash.  Neurological:     Mental Status: He is alert and oriented to person, place, and time. Mental status is at baseline.  Psychiatric:        Mood and Affect: Mood normal.        Behavior: Behavior normal.        Thought Content: Thought content normal.        Judgment: Judgment normal.     No results found. No results found. No results found for this or any previous visit (from the past 24 hour(s)).  Assessment/Plan: Ryan Fisher is a 51 y.o. male present for OV for  Lumbar pain *** Reviewed expectations re: course of current medical issues. Discussed self-management of symptoms. Outlined signs and symptoms indicating need for more acute intervention. Patient verbalized understanding and all questions were answered. Patient received an After-Visit Summary.    Orders Placed This Encounter  Procedures   Flu Vaccine QUAD 6+ mos PF IM (Fluarix Quad PF)   No orders of the defined types were placed in this encounter.  Referral Orders  No referral(s) requested today     Note is dictated utilizing voice recognition software. Although note has been proof  read prior to signing, occasional typographical errors still can be missed. If any questions arise, please do not hesitate to call for verification.  electronically signed by:  Howard Pouch, DO  [facility]

## 2021-11-08 NOTE — Patient Instructions (Signed)
Low Back Sprain or Strain Rehab Ask your health care provider which exercises are safe for you. Do exercises exactly as told by your health care provider and adjust them as directed. It is normal to feel mild stretching, pulling, tightness, or discomfort as you do these exercises. Stop right away if you feel sudden pain or your pain gets worse. Do not begin these exercises until told by your health care provider. Stretching and range-of-motion exercises These exercises warm up your muscles and joints and improve the movement and flexibility of your back. These exercises also help to relieve pain, numbness, and tingling. Lumbar rotation  Lie on your back on a firm bed or the floor with your knees bent. Straighten your arms out to your sides so each arm forms a 90-degree angle (right angle) with a side of your body. Slowly move (rotate) both of your knees to one side of your body until you feel a stretch in your lower back (lumbar). Try not to let your shoulders lift off the floor. Hold this position for __________ seconds. Tense your abdominal muscles and slowly move your knees back to the starting position. Repeat this exercise on the other side of your body. Repeat __________ times. Complete this exercise __________ times a day. Single knee to chest  Lie on your back on a firm bed or the floor with both legs straight. Bend one of your knees. Use your hands to move your knee up toward your chest until you feel a gentle stretch in your lower back and buttock. Hold your leg in this position by holding on to the front of your knee. Keep your other leg as straight as possible. Hold this position for __________ seconds. Slowly return to the starting position. Repeat with your other leg. Repeat __________ times. Complete this exercise __________ times a day. Prone extension on elbows  Lie on your abdomen on a firm bed or the floor (prone position). Prop yourself up on your elbows. Use your arms  to help lift your chest up until you feel a gentle stretch in your abdomen and your lower back. This will place some of your body weight on your elbows. If this is uncomfortable, try stacking pillows under your chest. Your hips should stay down, against the surface that you are lying on. Keep your hip and back muscles relaxed. Hold this position for __________ seconds. Slowly relax your upper body and return to the starting position. Repeat __________ times. Complete this exercise __________ times a day. Strengthening exercises These exercises build strength and endurance in your back. Endurance is the ability to use your muscles for a long time, even after they get tired. Pelvic tilt This exercise strengthens the muscles that lie deep in the abdomen. Lie on your back on a firm bed or the floor with your legs extended. Bend your knees so they are pointing toward the ceiling and your feet are flat on the floor. Tighten your lower abdominal muscles to press your lower back against the floor. This motion will tilt your pelvis so your tailbone points up toward the ceiling instead of pointing to your feet or the floor. To help with this exercise, you may place a small towel under your lower back and try to push your back into the towel. Hold this position for __________ seconds. Let your muscles relax completely before you repeat this exercise. Repeat __________ times. Complete this exercise __________ times a day. Alternating arm and leg raises  Get on your hands   and knees on a firm surface. If you are on a hard floor, you may want to use padding, such as an exercise mat, to cushion your knees. Line up your arms and legs. Your hands should be directly below your shoulders, and your knees should be directly below your hips. Lift your left leg behind you. At the same time, raise your right arm and straighten it in front of you. Do not lift your leg higher than your hip. Do not lift your arm higher  than your shoulder. Keep your abdominal and back muscles tight. Keep your hips facing the ground. Do not arch your back. Keep your balance carefully, and do not hold your breath. Hold this position for __________ seconds. Slowly return to the starting position. Repeat with your right leg and your left arm. Repeat __________ times. Complete this exercise __________ times a day. Abdominal set with straight leg raise  Lie on your back on a firm bed or the floor. Bend one of your knees and keep your other leg straight. Tense your abdominal muscles and lift your straight leg up, 4-6 inches (10-15 cm) off the ground. Keep your abdominal muscles tight and hold this position for __________ seconds. Do not hold your breath. Do not arch your back. Keep it flat against the ground. Keep your abdominal muscles tense as you slowly lower your leg back to the starting position. Repeat with your other leg. Repeat __________ times. Complete this exercise __________ times a day. Single leg lower with bent knees Lie on your back on a firm bed or the floor. Tense your abdominal muscles and lift your feet off the floor, one foot at a time, so your knees and hips are bent in 90-degree angles (right angles). Your knees should be over your hips and your lower legs should be parallel to the floor. Keeping your abdominal muscles tense and your knee bent, slowly lower one of your legs so your toe touches the ground. Lift your leg back up to return to the starting position. Do not hold your breath. Do not let your back arch. Keep your back flat against the ground. Repeat with your other leg. Repeat __________ times. Complete this exercise __________ times a day. Posture and body mechanics Good posture and healthy body mechanics can help to relieve stress in your body's tissues and joints. Body mechanics refers to the movements and positions of your body while you do your daily activities. Posture is part of body  mechanics. Good posture means: Your spine is in its natural S-curve position (neutral). Your shoulders are pulled back slightly. Your head is not tipped forward (neutral). Follow these guidelines to improve your posture and body mechanics in your everyday activities. Standing  When standing, keep your spine neutral and your feet about hip-width apart. Keep a slight bend in your knees. Your ears, shoulders, and hips should line up. When you do a task in which you stand in one place for a long time, place one foot up on a stable object that is 2-4 inches (5-10 cm) high, such as a footstool. This helps keep your spine neutral. Sitting  When sitting, keep your spine neutral and keep your feet flat on the floor. Use a footrest, if necessary, and keep your thighs parallel to the floor. Avoid rounding your shoulders, and avoid tilting your head forward. When working at a desk or a computer, keep your desk at a height where your hands are slightly lower than your elbows. Slide your   chair under your desk so you are close enough to maintain good posture. When working at a computer, place your monitor at a height where you are looking straight ahead and you do not have to tilt your head forward or downward to look at the screen. Resting When lying down and resting, avoid positions that are most painful for you. If you have pain with activities such as sitting, bending, stooping, or squatting, lie in a position in which your body does not bend very much. For example, avoid curling up on your side with your arms and knees near your chest (fetal position). If you have pain with activities such as standing for a long time or reaching with your arms, lie with your spine in a neutral position and bend your knees slightly. Try the following positions: Lying on your side with a pillow between your knees. Lying on your back with a pillow under your knees. Lifting  When lifting objects, keep your feet at least  shoulder-width apart and tighten your abdominal muscles. Bend your knees and hips and keep your spine neutral. It is important to lift using the strength of your legs, not your back. Do not lock your knees straight out. Always ask for help to lift heavy or awkward objects. This information is not intended to replace advice given to you by your health care provider. Make sure you discuss any questions you have with your health care provider. Document Revised: 05/18/2020 Document Reviewed: 05/18/2020 Elsevier Patient Education  [place].

## 2021-11-10 ENCOUNTER — Telehealth: Payer: Self-pay | Admitting: Family Medicine

## 2021-11-10 NOTE — Telephone Encounter (Signed)
Pt will complete tomorrow

## 2021-11-10 NOTE — Telephone Encounter (Signed)
Please remind pt to have xray of his back completed at  [facility]. I am waiting on that result to close his note and decide on PT referral

## 2021-11-11 ENCOUNTER — Telehealth: Payer: Self-pay | Admitting: Family Medicine

## 2021-11-11 ENCOUNTER — Ambulatory Visit (INDEPENDENT_AMBULATORY_CARE_PROVIDER_SITE_OTHER): Payer: BC Managed Care – PPO

## 2021-11-11 DIAGNOSIS — M545 Low back pain, unspecified: Secondary | ICD-10-CM

## 2021-11-11 DIAGNOSIS — M5136 Other intervertebral disc degeneration, lumbar region: Secondary | ICD-10-CM

## 2021-11-11 NOTE — Telephone Encounter (Signed)
Spoke with pt regarding labs and instructions.   

## 2021-11-11 NOTE — Telephone Encounter (Signed)
Please call patient His x-ray resulted with multiple bone spurs and degenerative changes of the lower lumbar spine. I have placed the order to [facility] physical therapy for him to start.  If physical therapy does not help his range of motion or his pain worsens-I would encourage the patient to follow-up at that time and we would need to consider ordering MRI and/or referring to orthopedics

## 2021-11-19 DIAGNOSIS — M5136 Other intervertebral disc degeneration, lumbar region: Secondary | ICD-10-CM | POA: Diagnosis not present

## 2021-11-23 DIAGNOSIS — M5136 Other intervertebral disc degeneration, lumbar region: Secondary | ICD-10-CM | POA: Diagnosis not present

## 2021-11-25 DIAGNOSIS — M5136 Other intervertebral disc degeneration, lumbar region: Secondary | ICD-10-CM | POA: Diagnosis not present

## 2021-11-29 DIAGNOSIS — M5136 Other intervertebral disc degeneration, lumbar region: Secondary | ICD-10-CM | POA: Diagnosis not present

## 2021-12-16 DIAGNOSIS — M5136 Other intervertebral disc degeneration, lumbar region: Secondary | ICD-10-CM | POA: Diagnosis not present

## 2021-12-20 ENCOUNTER — Ambulatory Visit: Payer: BC Managed Care – PPO | Admitting: Family Medicine

## 2021-12-22 ENCOUNTER — Telehealth: Payer: Self-pay

## 2021-12-22 NOTE — Telephone Encounter (Signed)
Received pt reeval form. Placed on PCP desk for signature.

## 2021-12-24 NOTE — Telephone Encounter (Signed)
faxed

## 2021-12-24 NOTE — Telephone Encounter (Signed)
Completed form

## 2021-12-27 ENCOUNTER — Ambulatory Visit: Payer: BC Managed Care – PPO | Admitting: Family Medicine

## 2021-12-29 ENCOUNTER — Encounter: Payer: Self-pay | Admitting: Family Medicine

## 2021-12-29 ENCOUNTER — Ambulatory Visit: Payer: BC Managed Care – PPO | Admitting: Family Medicine

## 2021-12-29 VITALS — BP 121/80 | HR 71 | Temp 98.2°F | Wt 288.2 lb

## 2021-12-29 DIAGNOSIS — E1169 Type 2 diabetes mellitus with other specified complication: Secondary | ICD-10-CM

## 2021-12-29 DIAGNOSIS — E785 Hyperlipidemia, unspecified: Secondary | ICD-10-CM | POA: Diagnosis not present

## 2021-12-29 DIAGNOSIS — N401 Enlarged prostate with lower urinary tract symptoms: Secondary | ICD-10-CM

## 2021-12-29 DIAGNOSIS — I10 Essential (primary) hypertension: Secondary | ICD-10-CM

## 2021-12-29 DIAGNOSIS — R351 Nocturia: Secondary | ICD-10-CM

## 2021-12-29 LAB — POCT GLYCOSYLATED HEMOGLOBIN (HGB A1C)
HbA1c POC (<> result, manual entry): 6.1 % (ref 4.0–5.6)
HbA1c, POC (controlled diabetic range): 6.1 % (ref 0.0–7.0)
HbA1c, POC (prediabetic range): 6.1 % (ref 5.7–6.4)
Hemoglobin A1C: 6.1 % — AB (ref 4.0–5.6)

## 2021-12-29 MED ORDER — ATORVASTATIN CALCIUM 40 MG PO TABS: 40.0000 mg | ORAL_TABLET | Freq: Every day | ORAL | 1 refills | Status: DC

## 2021-12-29 MED ORDER — MELOXICAM 15 MG PO TABS: 15.0000 mg | ORAL_TABLET | Freq: Every day | ORAL | 1 refills | Status: DC

## 2021-12-29 MED ORDER — AMLODIPINE BESYLATE 10 MG PO TABS: 10.0000 mg | ORAL_TABLET | Freq: Every day | ORAL | 1 refills | Status: DC

## 2021-12-29 MED ORDER — METFORMIN HCL 1000 MG PO TABS: 1000.0000 mg | ORAL_TABLET | Freq: Every day | ORAL | 1 refills | Status: DC

## 2021-12-29 MED ORDER — LISINOPRIL 40 MG PO TABS: 40.0000 mg | ORAL_TABLET | Freq: Every day | ORAL | 1 refills | Status: DC

## 2021-12-29 MED ORDER — HYDROCHLOROTHIAZIDE 25 MG PO TABS: 25.0000 mg | ORAL_TABLET | Freq: Every day | ORAL | 1 refills | Status: DC

## 2021-12-29 MED ORDER — TAMSULOSIN HCL 0.4 MG PO CAPS: 0.4000 mg | ORAL_CAPSULE | Freq: Every day | ORAL | 1 refills | Status: DC

## 2021-12-29 NOTE — Patient Instructions (Addendum)
Return in about 4 months (around 05/05/2022) for cpe (20 min), Routine chronic condition follow-up.        Great to see you today.  I have refilled the medication(s) we provide.   If labs were collected, we will inform you of lab results once received either by echart message or telephone call.   - echart message- for normal results that have been seen by the patient already.   - telephone call: abnormal results or if patient has not viewed results in their echart.

## 2021-12-29 NOTE — Progress Notes (Signed)
Patient ID: Ryan Fisher, male  DOB: 06-16-1970, 51 y.o.   MRN: [id number] Patient Care Team    Relationship Specialty Notifications Start End  Ma Hillock, DO PCP - General Family Medicine  08/30/16   Celestia Khat, OD  Optometry  08/04/20   Warren Danes, PA-C Physician Assistant Dermatology  01/27/21     Chief Complaint  Patient presents with   Diabetes    Subjective:  Ryan Fisher is a 51 y.o. male present for [facility]. All past medical history, surgical history, allergies, family history, immunizations, medications and social history were updated in the electronic medical record today. All recent labs, ED visits and hospitalizations within the last year were reviewed.  diabetes type 2/morbid obesity: Diagnosed 04/06/2020 Pt reports compliance with metformin 1000 mg twice daily.Patient denies dizziness, hyperglycemic or hypoglycemic events. Patient denies numbness, tingling in the extremities or nonhealing wounds of feet.   Hypertension/HLD/Obesity:  Pt reports compliance with lisinopril 40 mg daily and amlodipine 10 mg daily, hctz 25 mg qd.Patient denies chest pain, shortness of breath, dizziness or lower extremity edema.  Pt does not take a daily baby ASA. Pt is  taking his statin daily.  Diet: low sodium Exercise: not routinely RF: HTN, obesity, Fhx HD.  Daily smoker.    BPH: Pt reports his condition is stable with flomax use     12/30/2020    2:45 PM 04/01/2020    2:26 PM 09/06/2016   10:34 AM 08/30/2016    1:59 PM  Depression screen PHQ 2/9  Decreased Interest 0 0 0 0  Down, Depressed, Hopeless 0 0 0 0  PHQ - 2 Score 0 0 0 0       No data to display                 09/06/2016   10:34 AM  Fall Risk   Falls in the past year? No    Immunization History  Administered Date(s) Administered   Influenza,inj,Quad PF,6+ Mos 11/29/2018, 12/30/2020, 11/08/2021   Influenza-Unspecified 01/11/2020   PFIZER(Purple Top)SARS-COV-2 Vaccination 06/10/2019,  07/02/2019, 02/19/2020   Pneumococcal Polysaccharide-23 09/06/2016   Tdap 03/14/2013   Zoster Recombinat (Shingrix) 05/04/2021, 07/26/2021   Past Medical History:  Diagnosis Date   Hyperlipidemia    Hypertension    Allergies  Allergen Reactions   Morphine And Related Other (See Comments)    Morphine only- sweating, flushing.    Past Surgical History:  Procedure Laterality Date   COLONOSCOPY     ROTATOR CUFF REPAIR Left 2012   left   Family History  Problem Relation Age of Onset   Arthritis Mother    Atrial fibrillation Mother    Osteoporosis Mother    Allergies Brother    Asthma Brother    Brain cancer Maternal Aunt    Early death Paternal Uncle    Heart failure Paternal Grandfather    Colon polyps Neg Hx    Colon cancer Neg Hx    Esophageal cancer Neg Hx    Stomach cancer Neg Hx    Rectal cancer Neg Hx    Social History   Social History Narrative   Married to [place].    Some college attendance. 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    Drinks caffeine.    Wears a seatbelt and bicycle helmet.    Smoke detector in the home. Firearms locked in the home.    Feels safe in his relationships.     Allergies as  of 12/29/2021       Reactions   Morphine And Related Other (See Comments)   Morphine only- sweating, flushing.         Medication List        Accurate as of December 29, 2021  5:17 PM. If you have any questions, ask your nurse or doctor.          STOP taking these medications    cyclobenzaprine 10 MG tablet Commonly known as: FLEXERIL Stopped by: Howard Pouch, DO       TAKE these medications    amLODipine 10 MG tablet Commonly known as: NORVASC Take 1 tablet (10 mg total) by mouth daily.   atorvastatin 40 MG tablet Commonly known as: LIPITOR Take 1 tablet (40 mg total) by mouth daily.   hydrochlorothiazide 25 MG tablet Commonly known as: HYDRODIURIL Take 1 tablet (25 mg total) by mouth daily.   lisinopril 40 MG tablet Commonly  known as: ZESTRIL Take 1 tablet (40 mg total) by mouth daily.   meloxicam 15 MG tablet Commonly known as: MOBIC Take 1 tablet (15 mg total) by mouth daily.   metFORMIN 1000 MG tablet Commonly known as: GLUCOPHAGE Take 1 tablet (1,000 mg total) by mouth daily with breakfast.   tamsulosin 0.4 MG Caps capsule Commonly known as: FLOMAX Take 1 capsule (0.4 mg total) by mouth daily.       All past medical history, surgical history, allergies, family history, immunizations andmedications were updated in the EMR today and reviewed under the history and medication portions of their EMR.      ROS 14 pt review of systems performed and negative (unless mentioned in an HPI)  Objective: BP 121/80   Pulse 71   Temp 98.2 F (36.8 C)   Wt 288 lb 3.2 oz (130.7 kg)   SpO2 97%   BMI 37.00 kg/m  Physical Exam Constitutional:      General: He is not in acute distress.    Appearance: Normal appearance. He is obese. He is not ill-appearing or toxic-appearing.  HENT:     Head: Normocephalic and atraumatic.  Eyes:     General: No scleral icterus.       Right eye: No discharge.        Left eye: No discharge.     Extraocular Movements: Extraocular movements intact.     Pupils: Pupils are equal, round, and reactive to light.  Cardiovascular:     Rate and Rhythm: Normal rate and regular rhythm.     Pulses: Normal pulses.     Heart sounds: Normal heart sounds. No murmur heard.    No friction rub. No gallop.  Pulmonary:     Effort: Pulmonary effort is normal. No respiratory distress.     Breath sounds: Normal breath sounds. No stridor. No wheezing, rhonchi or rales.  Chest:     Chest wall: No tenderness.  Musculoskeletal:     Right lower leg: No edema.     Left lower leg: No edema.  Skin:    Findings: No rash.  Neurological:     General: No focal deficit present.     Mental Status: He is alert and oriented to person, place, and time. Mental status is at baseline.     Cranial Nerves: No  cranial nerve deficit.     Sensory: No sensory deficit.     Motor: No weakness.     Coordination: Coordination normal.     Gait: Gait normal.  Deep Tendon Reflexes: Reflexes normal.  Psychiatric:        Mood and Affect: Mood normal.        Behavior: Behavior normal.        Thought Content: Thought content normal.        Judgment: Judgment normal.     No results found.  Assessment/plan: Pius Byrom is a 51 y.o. male present for [facility] type 2 diabetes mellitus ([place]) with HLD -continue  metformin 1000 mg daily - continue  statin -DC Januvia 100 mg daily- doing great.  -Goal A1c < 6.5 would be best -Patient was instructed on normal glucose range and glucose testing. -Patient was educated on diabetic diet and glycemic index goals. PNA series: Completed 09/06/2016 Flu shot: UTD 2023 (recommneded yearly) Foot exam: Completed 05/04/2021 Eye exam: Up-to-date 02/2021 A1c: 9.1>5.3> 6.0> 5.9> 5.9> 6.1 collected today  5.5 mos f/u  Essential hypertension/Morbid obesity (HCC)/HLD/Obesity, Class II, BMI 35-39.9 Stable Continue amlodipine to 10 mg Continue lisinopril to 40 mg QD.  Continue HCTZ 25 mg qd   -He was counseled on low-sodium diet, routine exercise and routine follow-up. Continue atorvastatin  BPH:  Stable Continue flomax    Ok to refer to SM ([place]-omt eval and treat) if he calls in within the next month.  Return in about 4 months (around 05/05/2022) for cpe (20 min), Routine chronic condition follow-up.   Orders Placed This Encounter  Procedures   POCT HgB A1C   Meds ordered this encounter  Medications   lisinopril (ZESTRIL) 40 MG tablet    Sig: Take 1 tablet (40 mg total) by mouth daily.    Dispense:  90 tablet    Refill:  1   hydrochlorothiazide (HYDRODIURIL) 25 MG tablet    Sig: Take 1 tablet (25 mg total) by mouth daily.    Dispense:  90 tablet    Refill:  1   atorvastatin (LIPITOR) 40 MG tablet    Sig: Take 1 tablet (40 mg total) by mouth daily.     Dispense:  90 tablet    Refill:  1   amLODipine (NORVASC) 10 MG tablet    Sig: Take 1 tablet (10 mg total) by mouth daily.    Dispense:  90 tablet    Refill:  1   metFORMIN (GLUCOPHAGE) 1000 MG tablet    Sig: Take 1 tablet (1,000 mg total) by mouth daily with breakfast.    Dispense:  90 tablet    Refill:  1   tamsulosin (FLOMAX) 0.4 MG CAPS capsule    Sig: Take 1 capsule (0.4 mg total) by mouth daily.    Dispense:  90 capsule    Refill:  1   meloxicam (MOBIC) 15 MG tablet    Sig: Take 1 tablet (15 mg total) by mouth daily.    Dispense:  90 tablet    Refill:  1   Referral Orders  No referral(s) requested today     Note is dictated utilizing voice recognition software. Although note has been proof read prior to signing, occasional typographical errors still can be missed. If any questions arise, please do not hesitate to call for verification.  Electronically signed by: Howard Pouch, DO [place]

## 2021-12-29 NOTE — Progress Notes (Deleted)
Ryan Fisher , [birth year], 51 y.o., male MRN: [id number] Patient Care Team    Relationship Specialty Notifications Start End  Ma Hillock, DO PCP - General Family Medicine  08/30/16   Celestia Khat, OD  Optometry  08/04/20   Warren Danes, PA-C Physician Assistant Dermatology  01/27/21     No chief complaint on file.    Subjective: Pt presents for an OV to follow up on lumbar pain that started about 6 weeks ago. He reports he has seen improvement in his pain, but he is still having discomfort when standing too long or sitting too long. Most of his discomfort is now on the left portion of his lumbar spine. He denies any numbness or tingling. Treated with prednisone taper and MR.  Prior note: with complaints of left-sided low back pain approximately 2 weeks duration has been steadily worsening over the last few days.  He denies any radiation of pain to his lower extremities.  He states he was lifting heavy objects and pain started shortly thereafter.  He has not had any history of lower back surgeries or chronic back pain.  He states laying flat and transitioning positions will cause pain.  He denies any bowel or bladder changes.  He has tried taking anti-inflammatories.     12/30/2020    2:45 PM 04/01/2020    2:26 PM 09/06/2016   10:34 AM 08/30/2016    1:59 PM  Depression screen PHQ 2/9  Decreased Interest 0 0 0 0  Down, Depressed, Hopeless 0 0 0 0  PHQ - 2 Score 0 0 0 0    Allergies  Allergen Reactions   Morphine And Related Other (See Comments)    Morphine only- sweating, flushing.    Social History   Social History Narrative   Married to [place].    Some college attendance. 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    Drinks caffeine.    Wears a seatbelt and bicycle helmet.    Smoke detector in the home. Firearms locked in the home.    Feels safe in his relationships.    Past Medical History:  Diagnosis Date   Hyperlipidemia    Hypertension    Past Surgical  History:  Procedure Laterality Date   COLONOSCOPY     ROTATOR CUFF REPAIR Left 2012   left   Family History  Problem Relation Age of Onset   Arthritis Mother    Atrial fibrillation Mother    Osteoporosis Mother    Allergies Brother    Asthma Brother    Brain cancer Maternal Aunt    Early death Paternal Uncle    Heart failure Paternal Grandfather    Colon polyps Neg Hx    Colon cancer Neg Hx    Esophageal cancer Neg Hx    Stomach cancer Neg Hx    Rectal cancer Neg Hx    Allergies as of 12/29/2021       Reactions   Morphine And Related Other (See Comments)   Morphine only- sweating, flushing.         Medication List        Accurate as of December 29, 2021 12:11 PM. If you have any questions, ask your nurse or doctor.          amLODipine 10 MG tablet Commonly known as: NORVASC Take 1 tablet (10 mg total) by mouth daily.   atorvastatin 40 MG tablet Commonly known as: LIPITOR Take 1 tablet (40  mg total) by mouth daily.   cyclobenzaprine 10 MG tablet Commonly known as: FLEXERIL Take 1 tablet (10 mg total) by mouth 3 (three) times daily as needed for muscle spasms.   hydrochlorothiazide 25 MG tablet Commonly known as: HYDRODIURIL Take 1 tablet (25 mg total) by mouth daily.   lisinopril 40 MG tablet Commonly known as: ZESTRIL Take 1 tablet (40 mg total) by mouth daily.   meloxicam 15 MG tablet Commonly known as: MOBIC Take 1 tablet (15 mg total) by mouth daily.   metFORMIN 1000 MG tablet Commonly known as: GLUCOPHAGE Take 1 tablet (1,000 mg total) by mouth daily with breakfast.   tamsulosin 0.4 MG Caps capsule Commonly known as: FLOMAX Take 1 capsule (0.4 mg total) by mouth daily.        All past medical history, surgical history, allergies, family history, immunizations andmedications were updated in the EMR today and reviewed under the history and medication portions of their EMR.     ROS Negative, with the exception of above mentioned in  HPI   Objective:  There were no vitals taken for this visit. There is no height or weight on file to calculate BMI. Physical Exam Vitals and nursing note reviewed.  Constitutional:      General: He is not in acute distress.    Appearance: Normal appearance. He is not ill-appearing, toxic-appearing or diaphoretic.  HENT:     Head: Normocephalic and atraumatic.  Eyes:     General: No scleral icterus.       Right eye: No discharge.        Left eye: No discharge.     Extraocular Movements: Extraocular movements intact.     Pupils: Pupils are equal, round, and reactive to light.  Musculoskeletal:     Lumbar back: Swelling and spasms present. No edema, deformity or signs of trauma. Decreased range of motion.     Comments: TTP left lumbar paraspinal muscles.Discomfort with left side bending and extension.  Discomfort transitioning between sitting and standing. Neurovascular intact distally  Skin:    General: Skin is warm and dry.     Coloration: Skin is not jaundiced or pale.     Findings: No rash.  Neurological:     Mental Status: He is alert and oriented to person, place, and time. Mental status is at baseline.  Psychiatric:        Mood and Affect: Mood normal.        Behavior: Behavior normal.        Thought Content: Thought content normal.        Judgment: Judgment normal.     No results found. No results found. No results found for this or any previous visit (from the past 24 hour(s)).  Assessment/Plan: Ryan Fisher is a 51 y.o. male present for OV for  Lumbar pain Acute midline low back pain without sciatica Back pain, improving, but not resolved after 6 weeks of conservative tx.  Start mobic qd with food - DG Lumbar Spine Complete; Future - If xray normal will refer to OR PT- pt is agreeable.  F/u PRN   Influenza vaccine needed - Flu Vaccine QUAD 6+ mos PF IM (Fluarix Quad PF)  Reviewed expectations re: course of current medical issues.  Discussed self-management  of symptoms. Outlined signs and symptoms indicating need for more acute intervention. Patient verbalized understanding and all questions were answered. Patient received an After-Visit Summary.    No orders of the defined types were placed in this  encounter.  No orders of the defined types were placed in this encounter.  Referral Orders  No referral(s) requested today     Note is dictated utilizing voice recognition software. Although note has been proof read prior to signing, occasional typographical errors still can be missed. If any questions arise, please do not hesitate to call for verification.   electronically signed by:  Howard Pouch, DO  [place]

## 2022-01-05 ENCOUNTER — Ambulatory Visit: Payer: BC Managed Care – PPO | Admitting: Family Medicine

## 2022-05-02 ENCOUNTER — Encounter: Payer: Self-pay | Admitting: Family Medicine

## 2022-05-02 ENCOUNTER — Ambulatory Visit (INDEPENDENT_AMBULATORY_CARE_PROVIDER_SITE_OTHER): Payer: BC Managed Care – PPO | Admitting: Family Medicine

## 2022-05-02 VITALS — BP 117/74 | HR 93 | Temp 98.2°F | Wt 290.4 lb

## 2022-05-02 DIAGNOSIS — E785 Hyperlipidemia, unspecified: Secondary | ICD-10-CM | POA: Diagnosis not present

## 2022-05-02 DIAGNOSIS — F17210 Nicotine dependence, cigarettes, uncomplicated: Secondary | ICD-10-CM | POA: Insufficient documentation

## 2022-05-02 DIAGNOSIS — E1169 Type 2 diabetes mellitus with other specified complication: Secondary | ICD-10-CM | POA: Diagnosis not present

## 2022-05-02 DIAGNOSIS — Z125 Encounter for screening for malignant neoplasm of prostate: Secondary | ICD-10-CM | POA: Diagnosis not present

## 2022-05-02 DIAGNOSIS — N401 Enlarged prostate with lower urinary tract symptoms: Secondary | ICD-10-CM

## 2022-05-02 DIAGNOSIS — I10 Essential (primary) hypertension: Secondary | ICD-10-CM

## 2022-05-02 DIAGNOSIS — F17200 Nicotine dependence, unspecified, uncomplicated: Secondary | ICD-10-CM

## 2022-05-02 DIAGNOSIS — E669 Obesity, unspecified: Secondary | ICD-10-CM

## 2022-05-02 DIAGNOSIS — Z122 Encounter for screening for malignant neoplasm of respiratory organs: Secondary | ICD-10-CM

## 2022-05-02 DIAGNOSIS — M5136 Other intervertebral disc degeneration, lumbar region: Secondary | ICD-10-CM

## 2022-05-02 DIAGNOSIS — Z0000 Encounter for general adult medical examination without abnormal findings: Secondary | ICD-10-CM

## 2022-05-02 DIAGNOSIS — R351 Nocturia: Secondary | ICD-10-CM

## 2022-05-02 DIAGNOSIS — L600 Ingrowing nail: Secondary | ICD-10-CM

## 2022-05-02 HISTORY — DX: Nicotine dependence, cigarettes, uncomplicated: F17.210

## 2022-05-02 MED ORDER — MELOXICAM 15 MG PO TABS: 15.0000 mg | ORAL_TABLET | Freq: Every day | ORAL | 1 refills | Status: DC

## 2022-05-02 MED ORDER — HYDROCHLOROTHIAZIDE 25 MG PO TABS: 25.0000 mg | ORAL_TABLET | Freq: Every day | ORAL | 1 refills | Status: DC

## 2022-05-02 MED ORDER — LISINOPRIL 40 MG PO TABS: 40.0000 mg | ORAL_TABLET | Freq: Every day | ORAL | 1 refills | Status: DC

## 2022-05-02 MED ORDER — MUPIROCIN 2 % EX OINT: 1.0000 | TOPICAL_OINTMENT | Freq: Two times a day (BID) | CUTANEOUS | 0 refills | Status: DC

## 2022-05-02 MED ORDER — TAMSULOSIN HCL 0.4 MG PO CAPS: 0.4000 mg | ORAL_CAPSULE | Freq: Every day | ORAL | 1 refills | Status: DC

## 2022-05-02 MED ORDER — ATORVASTATIN CALCIUM 40 MG PO TABS: 40.0000 mg | ORAL_TABLET | Freq: Every day | ORAL | 1 refills | Status: DC

## 2022-05-02 MED ORDER — METFORMIN HCL 1000 MG PO TABS: 1000.0000 mg | ORAL_TABLET | Freq: Every day | ORAL | 1 refills | Status: DC

## 2022-05-02 MED ORDER — AMLODIPINE BESYLATE 10 MG PO TABS: 10.0000 mg | ORAL_TABLET | Freq: Every day | ORAL | 1 refills | Status: DC

## 2022-05-02 NOTE — Patient Instructions (Signed)
Return in about 24 weeks (around 10/17/2022) for Routine chronic condition follow-up.        Great to see you today.  I have refilled the medication(s) we provide.   If labs were collected, we will inform you of lab results once received either by echart message or telephone call.   - echart message- for normal results that have been seen by the patient already.   - telephone call: abnormal results or if patient has not viewed results in their echart.

## 2022-05-02 NOTE — Progress Notes (Signed)
Patient ID: Ryan Fisher, male  DOB: 02-Mar-1971, 52 y.o.   MRN: [id number] Patient Care Team    Relationship Specialty Notifications Start End  Ma Hillock, DO PCP - General Family Medicine  08/30/16   Celestia Khat, OD  Optometry  08/04/20   Warren Danes, PA-C Physician Assistant Dermatology  01/27/21   Lavena Bullion, DO Consulting Physician Gastroenterology  05/02/22     Chief Complaint  Patient presents with   Annual Exam    [facility]; pt is fasting    Subjective: Ryan Fisher is a 52 y.o. male present for CPE and Chronic Conditions/illness Management All past medical history, surgical history, allergies, family history, immunizations, medications and social history were updated in the electronic medical record today. All recent labs, ED visits and hospitalizations within the last year were reviewed.  Health maintenance:  Colonoscopy: Colonoscopy completed 07/14/2021-3-year follow-up recommended. Dr. Bryan Lemma Immunizations: tdap 03/2013, Influenza up-to-date 11/08/2021 (encouraged yearly), Pneumovax completed, Shingrix series completed Infectious disease screening: HIV completed, Hep C completed to testing PSA: no fhx.  Ordered PSA today. PSA:  Lab Results  Component Value Date   PSA 0.91 05/04/2021   PSA 0.94 11/29/2018  , pt was counseled on prostate cancer screenings.  Assistive device: none Oxygen [id number] Patient has a Dental home. Hospitalizations/ED visits: none  diabetes type 2/morbid obesity: Diagnosed 04/06/2020 Pt reports compliance with metformin 1000 mg twice daily.  Patient denies dizziness, hyperglycemic or hypoglycemic events. Patient denies numbness, tingling in the extremities or nonhealing wounds of feet.   Hypertension/HLD/Obesity:  Pt reports compliance with lisinopril 40 mg daily and amlodipine 10 mg daily. Blood pressures ranges at home are WNL. Patient denies chest pain, shortness of breath, dizziness or lower extremity edema.  Pt does not  take a daily baby ASA. Pt is  taking his statin daily.  Labs due Diet: low sodium Exercise: not routinely RF: HTN, obesity, Fhx HD.  Daily smoker.    BPH: Pt reports his condition is stable with flomax use  Tobacco use disorder/lung cancer screening Shared decision making visit for lung cancer screening: Patient was brought in today for a office visit concerning shared decision making for their lung cancer screening.Ryan Fisher is a 52 y.o. male Patient is between the ages of 46-80: Yes Patient is a current smoker with at least 20 year pack year history or Patient is a former smoker, quit less than 15 years ago and has a 20 pack year history : Yes Patient has current symptoms: No Patient has a  health problem that substantially limits life expectancy or the ability or willingness to have curative lung surgery: No      05/02/2022    3:30 PM 12/30/2020    2:45 PM 04/01/2020    2:26 PM 09/06/2016   10:34 AM 08/30/2016    1:59 PM  Depression screen PHQ 2/9  Decreased Interest 0 0 0 0 0  Down, Depressed, Hopeless 0 0 0 0 0  PHQ - 2 Score 0 0 0 0 0      05/02/2022    3:30 PM  GAD 7 : Generalized Anxiety Score  Nervous, Anxious, on Edge 0  Control/stop worrying 0  Worry too much - different things 0  Trouble relaxing 0  Restless 0  Easily annoyed or irritable 0  Afraid - awful might happen 0  Total GAD 7 Score 0          05/02/2022    3:30 PM 09/06/2016  10:34 AM  Fall Risk   Falls in the past year? 0 No  Risk for fall due to : No Fall Risks   Follow up Falls evaluation completed     Immunization History  Administered Date(s) Administered   Influenza,inj,Quad PF,6+ Mos 11/29/2018, 12/30/2020, 11/08/2021   Influenza-Unspecified 01/11/2020   PFIZER(Purple Top)SARS-COV-2 Vaccination 06/10/2019, 07/02/2019, 02/19/2020   Pneumococcal Polysaccharide-23 09/06/2016   Tdap 03/14/2013   Zoster Recombinat (Shingrix) 05/04/2021, 07/26/2021   Past Medical History:  Diagnosis  Date   Hyperlipidemia    Hypertension    Allergies  Allergen Reactions   Morphine And Related Other (See Comments)    Morphine only- sweating, flushing.    Past Surgical History:  Procedure Laterality Date   COLONOSCOPY     ROTATOR CUFF REPAIR Left 2012   left   Family History  Problem Relation Age of Onset   Arthritis Mother    Atrial fibrillation Mother    Osteoporosis Mother    Allergies Brother    Asthma Brother    Brain cancer Maternal Aunt    Early death Paternal Uncle    Heart failure Paternal Grandfather    Colon polyps Neg Hx    Colon cancer Neg Hx    Esophageal cancer Neg Hx    Stomach cancer Neg Hx    Rectal cancer Neg Hx    Social History   Social History Narrative   Married to [place].    Some college attendance. 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    Drinks caffeine.    Wears a seatbelt and bicycle helmet.    Smoke detector in the home. Firearms locked in the home.    Feels safe in his relationships.     Allergies as of 05/02/2022       Reactions   Morphine And Related Other (See Comments)   Morphine only- sweating, flushing.         Medication List        Accurate as of May 02, 2022  3:54 PM. If you have any questions, ask your nurse or doctor.          amLODipine 10 MG tablet Commonly known as: NORVASC Take 1 tablet (10 mg total) by mouth daily.   atorvastatin 40 MG tablet Commonly known as: LIPITOR Take 1 tablet (40 mg total) by mouth daily.   hydrochlorothiazide 25 MG tablet Commonly known as: HYDRODIURIL Take 1 tablet (25 mg total) by mouth daily.   lisinopril 40 MG tablet Commonly known as: ZESTRIL Take 1 tablet (40 mg total) by mouth daily.   meloxicam 15 MG tablet Commonly known as: MOBIC Take 1 tablet (15 mg total) by mouth daily.   metFORMIN 1000 MG tablet Commonly known as: GLUCOPHAGE Take 1 tablet (1,000 mg total) by mouth daily with breakfast.   mupirocin ointment 2 % Commonly known as:  BACTROBAN Apply 1 Application topically 2 (two) times daily. Started by: Howard Pouch, DO   tamsulosin 0.4 MG Caps capsule Commonly known as: FLOMAX Take 1 capsule (0.4 mg total) by mouth daily.       All past medical history, surgical history, allergies, family history, immunizations andmedications were updated in the EMR today and reviewed under the history and medication portions of their EMR.     No results found for this or any previous visit (from the past 2160 hour(s)).  ROS 14 pt review of systems performed and negative (unless mentioned in an HPI)  Objective: BP 117/74   Pulse  93   Temp 98.2 F (36.8 C)   Wt 290 lb 6.4 oz (131.7 kg)   SpO2 96%   BMI 37.29 kg/m  Physical Exam Constitutional:      General: He is not in acute distress.    Appearance: Normal appearance. He is obese. He is not ill-appearing, toxic-appearing or diaphoretic.  HENT:     Head: Normocephalic and atraumatic.     Right Ear: Tympanic membrane, ear canal and external ear normal. There is no impacted cerumen.     Left Ear: Tympanic membrane, ear canal and external ear normal. There is no impacted cerumen.     Nose: Nose normal. No congestion or rhinorrhea.     Mouth/Throat:     Mouth: Mucous membranes are moist.     Pharynx: Oropharynx is clear. No oropharyngeal exudate or posterior oropharyngeal erythema.  Eyes:     General: No scleral icterus.       Right eye: No discharge.        Left eye: No discharge.     Extraocular Movements: Extraocular movements intact.     Pupils: Pupils are equal, round, and reactive to light.  Cardiovascular:     Rate and Rhythm: Normal rate and regular rhythm.     Pulses: Normal pulses.     Heart sounds: Normal heart sounds. No murmur heard.    No friction rub. No gallop.  Pulmonary:     Effort: Pulmonary effort is normal. No respiratory distress.     Breath sounds: Normal breath sounds. No stridor. No wheezing, rhonchi or rales.  Chest:     Chest wall: No  tenderness.  Abdominal:     General: Abdomen is flat. Bowel sounds are normal. There is no distension.     Palpations: Abdomen is soft. There is no mass.     Tenderness: There is no abdominal tenderness. There is no right CVA tenderness, left CVA tenderness, guarding or rebound.     Hernia: No hernia is present.  Musculoskeletal:        General: No swelling or tenderness. Normal range of motion.     Cervical back: Normal range of motion and neck supple.     Right lower leg: No edema.     Left lower leg: No edema.  Lymphadenopathy:     Cervical: No cervical adenopathy.  Skin:    General: Skin is warm and dry.     Coloration: Skin is not jaundiced.     Findings: No bruising, lesion or rash.  Neurological:     General: No focal deficit present.     Mental Status: He is alert and oriented to person, place, and time. Mental status is at baseline.     Cranial Nerves: No cranial nerve deficit.     Sensory: No sensory deficit.     Motor: No weakness.     Coordination: Coordination normal.     Gait: Gait normal.     Deep Tendon Reflexes: Reflexes normal.  Psychiatric:        Mood and Affect: Mood normal.        Behavior: Behavior normal.        Thought Content: Thought content normal.        Judgment: Judgment normal.    Diabetic Foot Exam - Simple   Simple Foot Form Diabetic Foot exam was performed with the following findings: Yes 05/02/2022  3:32 PM  Visual Inspection No deformities, no ulcerations, no other skin breakdown bilaterally: Yes Sensation Testing Intact  to touch and monofilament testing bilaterally: Yes Pulse Check Posterior Tibialis and Dorsalis pulse intact bilaterally: Yes Comments Ingrown toenail left great toe- no erythema or drainage      No results found.  Assessment/plan: Ryan Fisher is a 52 y.o. male present for CPE and chronic condition management type 2 diabetes mellitus ([place]) with HLD -Continue metformin 1000 mg daily -Continue statin -DC Januvia  100 mg daily- doing great.  -Goal A1c <7, closer to 6.5 would be best -Patient was instructed on normal glucose range and glucose testing. -Patient was educated on diabetic diet and glycemic index goals. PNA series: Completed 09/06/2016 Flu shot: UTD 2023 (recommneded yearly) Foot exam: Completed 05/02/2022 Eye exam: Up-to-date 05/02/2022 A1c: 9.1>5.3> 6.0> 6.1 > collected today  6 mos f/u A1c, TSH, lipids, microalbumin collected today Ingrown toenail> referral to podiatry  Essential hypertension/Morbid obesity (HCC)/HLD/Obesity, Class II, BMI 35-39.9 -Stable - continue amlodipine to 10 mg -Continue lisinopril to 40 mg QD.  -Continue HCTZ 25 mg qd   -He was counseled on low-sodium diet, routine exercise and routine follow-up. - continue atorvastatin -CBC, CMP, TSH and lipids collected today - 4 mos. F/u  BPH:  Stable continue flomax   Lumbar DDD: Stable Continue Mobic 15 mg daily BMP collected today  Tobacco use disorder/lung cancer screening -Patient was counseled on lung cancer screening today.  Patient does meet criteria for lung cancer screening.  Patient would like to proceed with lung cancer screening.  Patient understands screening may warrant further studies or repeat studies if any abnormality is found.  Patient is agreeable. -Patient has had a recent kidney function test:Yes - CT CHEST LUNG CA SCREEN LOW DOSE W/O CM; Future - Follow-up upon screening results.   Routine general medical examination at a health care facility Patient was encouraged to exercise greater than 150 minutes a week. Patient was encouraged to choose a diet filled with fresh fruits and vegetables, and lean meats. AVS provided to patient today for education/recommendation on gender specific health and safety maintenance. Colonoscopy: Colonoscopy completed 07/14/2021-3-year follow-up recommended. Dr. Bryan Lemma Immunizations: tdap 03/2013, Influenza up-to-date 11/08/2021 (encouraged yearly), Pneumovax  completed, Shingrix series completed Infectious disease screening: HIV completed, Hep C completed to testing PSA: no fhx.  Ordered PSA today.  Return in about 24 weeks (around 10/17/2022) for Routine chronic condition follow-up.   Orders Placed This Encounter  Procedures   CT CHEST LUNG CANCER SCREENING LOW DOSE WO CONTRAST   Comprehensive metabolic panel   Hemoglobin A1c   Lipid panel   PSA   TSH   CBC   Urine Microalbumin w/creat. ratio   Ambulatory referral to Podiatry   Meds ordered this encounter  Medications   amLODipine (NORVASC) 10 MG tablet    Sig: Take 1 tablet (10 mg total) by mouth daily.    Dispense:  90 tablet    Refill:  1   atorvastatin (LIPITOR) 40 MG tablet    Sig: Take 1 tablet (40 mg total) by mouth daily.    Dispense:  90 tablet    Refill:  1   hydrochlorothiazide (HYDRODIURIL) 25 MG tablet    Sig: Take 1 tablet (25 mg total) by mouth daily.    Dispense:  90 tablet    Refill:  1   lisinopril (ZESTRIL) 40 MG tablet    Sig: Take 1 tablet (40 mg total) by mouth daily.    Dispense:  90 tablet    Refill:  1   meloxicam (MOBIC) 15 MG tablet  Sig: Take 1 tablet (15 mg total) by mouth daily.    Dispense:  90 tablet    Refill:  1   metFORMIN (GLUCOPHAGE) 1000 MG tablet    Sig: Take 1 tablet (1,000 mg total) by mouth daily with breakfast.    Dispense:  90 tablet    Refill:  1   tamsulosin (FLOMAX) 0.4 MG CAPS capsule    Sig: Take 1 capsule (0.4 mg total) by mouth daily.    Dispense:  90 capsule    Refill:  1   mupirocin ointment (BACTROBAN) 2 %    Sig: Apply 1 Application topically 2 (two) times daily.    Dispense:  22 g    Refill:  0    Can be ointment or cream- whichever better on formulary   Referral Orders         Ambulatory referral to Podiatry       Note is dictated utilizing voice recognition software. Although note has been proof read prior to signing, occasional typographical errors still can be missed. If any questions arise, please do  not hesitate to call for verification.  Electronically signed by: Howard Pouch, DO [place]

## 2022-05-03 LAB — LIPID PANEL
Cholesterol: 138 mg/dL (ref <200)
HDL: 40 mg/dL (ref >=40)
LDL Cholesterol (Calc): 76 mg/dL (calc)
Non-HDL Cholesterol (Calc): 98 mg/dL (calc) (ref <130)
Total CHOL/HDL Ratio: 3.5 (calc) (ref <5.0)
Triglycerides: 138 mg/dL (ref <150)

## 2022-05-03 LAB — CBC
HCT: 47.5 % (ref 38.5–50.0)
Hemoglobin: 16.7 g/dL (ref 13.2–17.1)
MCH: 31.3 pg (ref 27.0–33.0)
MCHC: 35.2 g/dL (ref 32.0–36.0)
MCV: 89 fL (ref 80.0–100.0)
MPV: 11.8 fL (ref 7.5–12.5)
Platelets: 277 10*3/uL (ref 140–400)
RBC: 5.34 10*6/uL (ref 4.20–5.80)
RDW: 12.6 % (ref 11.0–15.0)
WBC: 12.2 10*3/uL — ABNORMAL HIGH (ref 3.8–10.8)

## 2022-05-03 LAB — COMPREHENSIVE METABOLIC PANEL
AG Ratio: 1.6 (calc) (ref 1.0–2.5)
ALT: 25 U/L (ref 9–46)
AST: 20 U/L (ref 10–35)
Albumin: 4.5 g/dL (ref 3.6–5.1)
Alkaline phosphatase (APISO): 71 U/L (ref 35–144)
BUN: 12 mg/dL (ref 7–25)
CO2: 25 mmol/L (ref 20–32)
Calcium: 10 mg/dL (ref 8.6–10.3)
Chloride: 104 mmol/L (ref 98–110)
Creat: 1.15 mg/dL (ref 0.70–1.30)
Globulin: 2.9 g/dL (calc) (ref 1.9–3.7)
Glucose, Bld: 100 mg/dL — ABNORMAL HIGH (ref 65–99)
Potassium: 4.4 mmol/L (ref 3.5–5.3)
Sodium: 141 mmol/L (ref 135–146)
Total Bilirubin: 0.8 mg/dL (ref 0.2–1.2)
Total Protein: 7.4 g/dL (ref 6.1–8.1)

## 2022-05-03 LAB — HEMOGLOBIN A1C
Hgb A1c MFr Bld: 6.3 % of total Hgb — ABNORMAL HIGH (ref <5.7)
Mean Plasma Glucose: 134 mg/dL
eAG (mmol/L): 7.4 mmol/L

## 2022-05-03 LAB — TSH: TSH: 2.48 mIU/L (ref 0.40–4.50)

## 2022-05-03 LAB — MICROALBUMIN / CREATININE URINE RATIO
Creatinine, Urine: 149 mg/dL (ref 20–320)
Microalb Creat Ratio: 9 mcg/mg creat (ref <30)
Microalb, Ur: 1.3 mg/dL

## 2022-05-03 LAB — PSA: PSA: 0.99 ng/mL (ref <=4.00)

## 2022-05-13 ENCOUNTER — Ambulatory Visit (HOSPITAL_BASED_OUTPATIENT_CLINIC_OR_DEPARTMENT_OTHER)
Admission: RE | Admit: 2022-05-13 | Discharge: 2022-05-13 | Disposition: A | Discharge status: Home / Self Care | Payer: BC Managed Care – PPO | Source: Ambulatory Visit | Attending: Family Medicine | Admitting: Family Medicine

## 2022-05-13 DIAGNOSIS — Z122 Encounter for screening for malignant neoplasm of respiratory organs: Secondary | ICD-10-CM

## 2022-05-13 DIAGNOSIS — F17210 Nicotine dependence, cigarettes, uncomplicated: Secondary | ICD-10-CM

## 2022-05-13 DIAGNOSIS — F17200 Nicotine dependence, unspecified, uncomplicated: Secondary | ICD-10-CM | POA: Diagnosis not present

## 2022-05-13 DIAGNOSIS — Z87891 Personal history of nicotine dependence: Secondary | ICD-10-CM | POA: Diagnosis not present

## 2022-05-16 ENCOUNTER — Telehealth: Payer: Self-pay | Admitting: Family Medicine

## 2022-05-16 ENCOUNTER — Ambulatory Visit: Payer: BC Managed Care – PPO | Admitting: Podiatry

## 2022-05-16 ENCOUNTER — Encounter: Payer: Self-pay | Admitting: Podiatry

## 2022-05-16 DIAGNOSIS — I2510 Atherosclerotic heart disease of native coronary artery without angina pectoris: Secondary | ICD-10-CM

## 2022-05-16 DIAGNOSIS — B351 Tinea unguium: Secondary | ICD-10-CM | POA: Diagnosis not present

## 2022-05-16 DIAGNOSIS — Z79899 Other long term (current) drug therapy: Secondary | ICD-10-CM

## 2022-05-16 DIAGNOSIS — E119 Type 2 diabetes mellitus without complications: Secondary | ICD-10-CM

## 2022-05-16 DIAGNOSIS — I700 Atherosclerosis of aorta: Secondary | ICD-10-CM | POA: Insufficient documentation

## 2022-05-16 DIAGNOSIS — J432 Centrilobular emphysema: Secondary | ICD-10-CM | POA: Insufficient documentation

## 2022-05-16 MED ORDER — ATORVASTATIN CALCIUM 80 MG PO TABS: 80.0000 mg | ORAL_TABLET | Freq: Every day | ORAL | 3 refills | Status: DC

## 2022-05-16 MED ORDER — TERBINAFINE HCL 250 MG PO TABS: 250.0000 mg | ORAL_TABLET | Freq: Every day | ORAL | 0 refills | Status: DC

## 2022-05-16 NOTE — Telephone Encounter (Signed)
Please inform patient his lung cancer screening result has returned: It did not show any concerning pulmonary nodules.  There were cardiac/heart findings that resulted in at least some cholesterol/plaque formation in 3 of the coronary arteries.  This type of test does not evaluate full extent of plaque formation.  I have increased his atorvastatin to 80 mg for better protection.  I have also referred him to cardiology to be further evaluated on the extent of coronary artery disease present Image did result in lung findings, with evidence of COPD changes-mild to moderate.  This is caused by his smoking history.  I have referred him to pulmonology for evaluation of his COPD to ensure he is not needing treatment.  Since he also has coronary artery disease, we need to make sure were not putting added strain on the heart from his COPD as well.   Recommendations are to continue the yearly low-dose chest CT without contrast for lung cancer screening purposes.   He should be receiving a call from pulmonary and cardiology to establish for further evaluation.

## 2022-05-16 NOTE — Telephone Encounter (Signed)
Spoke with patient regarding results/recommendations.  

## 2022-05-16 NOTE — Patient Instructions (Signed)
Diabetes Mellitus and Foot Care Diabetes, also called diabetes mellitus, may cause problems with your feet and legs because of poor blood flow (circulation). Poor circulation may make your skin: Become thinner and drier. Break more easily. Heal more slowly. Peel and crack. You may also have nerve damage (neuropathy). This can cause decreased feeling in your legs and feet. This means that you may not notice minor injuries to your feet that could lead to more serious problems. Finding and treating problems early is the best way to prevent future foot problems. How to care for your feet Foot hygiene  Wash your feet daily with warm water and mild soap. Do not use hot water. Then, pat your feet and the areas between your toes until they are fully dry. Do not soak your feet. This can dry your skin. Trim your toenails straight across. Do not dig under them or around the cuticle. File the edges of your nails with an emery board or nail file. Apply a moisturizing lotion or petroleum jelly to the skin on your feet and to dry, brittle toenails. Use lotion that does not contain alcohol and is unscented. Do not apply lotion between your toes. Shoes and socks Wear clean socks or stockings every day. Make sure they are not too tight. Do not wear knee-high stockings. These may decrease blood flow to your legs. Wear shoes that fit well and have enough cushioning. Always look in your shoes before you put them on to be sure there are no objects inside. To break in new shoes, wear them for just a few hours a day. This prevents injuries on your feet. Wounds, scrapes, corns, and calluses  Check your feet daily for blisters, cuts, bruises, sores, and redness. If you cannot see the bottom of your feet, use a mirror or ask someone for help. Do not cut off corns or calluses or try to remove them with medicine. If you find a minor scrape, cut, or break in the skin on your feet, keep it and the skin around it clean and  dry. You may clean these areas with mild soap and water. Do not clean the area with peroxide, alcohol, or iodine. If you have a wound, scrape, corn, or callus on your foot, look at it several times a day to make sure it is healing and not infected. Check for: Redness, swelling, or pain. Fluid or blood. Warmth. Pus or a bad smell. General tips Do not cross your legs. This may decrease blood flow to your feet. Do not use heating pads or hot water bottles on your feet. They may burn your skin. If you have lost feeling in your feet or legs, you may not know this is happening until it is too late. Protect your feet from hot and cold by wearing shoes, such as at the beach or on hot pavement. Schedule a complete foot exam at least once a year or more often if you have foot problems. Report any cuts, sores, or bruises to your health care provider right away. Where to find more information American Diabetes Association: diabetes.org Association of Diabetes Care & Education Specialists: diabeteseducator.org Contact a health care provider if: You have a condition that increases your risk of infection, and you have any cuts, sores, or bruises on your feet. You have an injury that is not healing. You have redness on your legs or feet. You feel burning or tingling in your legs or feet. You have pain or cramps in your legs   and feet. Your legs or feet are numb. Your feet always feel cold. You have pain around any toenails. Get help right away if: You have a wound, scrape, corn, or callus on your foot and: You have signs of infection. You have a fever. You have a red line going up your leg. This information is not intended to replace advice given to you by your health care provider. Make sure you discuss any questions you have with your health care provider. Document Revised: 09/01/2021 Document Reviewed: 09/01/2021 Elsevier Patient Education  [place]. Terbinafine Tablets What is this  medication? TERBINAFINE (TER bin a feen) treats fungal infections of the nails. It belongs to a group of medications called antifungals. It will not treat infections caused by bacteria or viruses. This medicine may be used for other purposes; ask your health care provider or pharmacist if you have questions. COMMON BRAND NAME(S): Lamisil, Terbinex What should I tell my care team before I take this medication? They need to know if you have any of these conditions: Liver disease An unusual or allergic reaction to terbinafine, other medications, foods, dyes, or preservatives Pregnant or trying to get pregnant Breast-feeding How should I use this medication? Take this medication by mouth with water. Take it as directed on the prescription label at the same time every day. You can take it with or without food. If it upsets your stomach, take it with food. Keep taking it unless your care team tells you to stop. A special MedGuide will be given to you by the pharmacist with each prescription and refill. Be sure to read this information carefully each time. Talk to your care team regarding the use of this medication in children. Special care may be needed. Overdosage: If you think you have taken too much of this medicine contact a poison control center or emergency room at once. NOTE: This medicine is only for you. Do not share this medicine with others. What if I miss a dose? If you miss a dose, take it as soon as you can unless it is more than 4 hours late. If it is more than 4 hours late, skip the missed dose. Take the next dose at the normal time. What may interact with this medication? Do not take this medication with any of the following: Pimozide Thioridazine This medication may also interact with the following: Beta blockers Caffeine Certain medications for mental health conditions Cimetidine Cyclosporine Medications for fungal infections like fluconazole and ketoconazole Medications for  irregular heartbeat like amiodarone, flecainide and propafenone Rifampin Warfarin This list may not describe all possible interactions. Give your health care provider a list of all the medicines, herbs, non-prescription drugs, or dietary supplements you use. Also tell them if you smoke, drink alcohol, or use illegal drugs. Some items may interact with your medicine. What should I watch for while using this medication? Visit your care team for regular checks on your progress. You may need blood work while you are taking this medication. It may be some time before you see the benefit from this medication. This medication may cause serious skin reactions. They can happen weeks to months after starting the medication. Contact your care team right away if you notice fevers or flu-like symptoms with a rash. The rash may be red or purple and then turn into blisters or peeling of the skin. Or, you might notice a red rash with swelling of the face, lips or lymph nodes in your neck or under your  arms. This medication can make you more sensitive to the sun. Keep out of the sun, If you cannot avoid being in the sun, wear protective clothing and sunscreen. Do not use sun lamps or tanning beds/booths. What side effects may I notice from receiving this medication? Side effects that you should report to your care team as soon as possible: Allergic reactions--skin rash, itching, hives, swelling of the face, lips, tongue, or throat Change in sense of smell Change in taste Infection--fever, chills, cough, or sore throat Liver injury--right upper belly pain, loss of appetite, nausea, light-colored stool, dark yellow or brown urine, yellowing skin or eyes, unusual weakness or fatigue Low red blood cell level--unusual weakness or fatigue, dizziness, headache, trouble breathing Lupus-like syndrome--joint pain, swelling, or stiffness, butterfly-shaped rash on the face, rashes that get worse in the sun, fever, unusual  weakness or fatigue Rash, fever, and swollen lymph nodes Redness, blistering, peeling, or loosening of the skin, including inside the mouth Unusual bruising or bleeding Worsening mood, feelings of depression Side effects that usually do not require medical attention (report to your care team if they continue or are bothersome): Diarrhea Gas Headache Nausea Stomach pain Upset stomach This list may not describe all possible side effects. Call your doctor for medical advice about side effects. You may report side effects to FDA at 1-800-FDA-1088. Where should I keep my medication? Keep out of the reach of children and pets. Store between 20 and 25 degrees C (68 and 77 degrees F). Protect from light. Get rid of any unused medication after the expiration date. To get rid of medications that are no longer needed or have expired: Take the medication to a medication take-back program. Check with your pharmacy or law enforcement to find a location. If you cannot return the medication, check the label or package insert to see if the medication should be thrown out in the garbage or flushed down the toilet. If you are not sure, ask your care team. If it is safe to put it in the trash, take the medication out of the container. Mix the medication with cat litter, dirt, coffee grounds, or other unwanted substance. Seal the mixture in a bag or container. Put it in the trash. NOTE: This sheet is a summary. It may not cover all possible information. If you have questions about this medicine, talk to your doctor, pharmacist, or health care provider.  2023 Elsevier/Gold Standard (2020-09-22 00:00:00)

## 2022-05-16 NOTE — Progress Notes (Signed)
Subjective:   Patient ID: Ryan Fisher, male   DOB: 52 y.o.   MRN: [id number]   HPI Chief Complaint  Patient presents with   Toe Pain    Hallux bilateral - medial border, ingrown toenails, wears steel toe shoes at work, diabetic-last A1c was 6.3, PCP wanted him to have checked rather than cutting them out himself   New Patient (Initial Visit)   52 year old male presents the office today with above concerns.  He states that he gets pain worse on the left than the right. Last year the right was painful when he had an ingrown and he had to trim it out before. He gets a lot of buildup under the nail. No drainage or pus.   Last A1c 6.3 on 05/02/2022  Review of Systems  All other systems reviewed and are negative.  Past Medical History:  Diagnosis Date   Hyperlipidemia    Hypertension     Past Surgical History:  Procedure Laterality Date   COLONOSCOPY     ROTATOR CUFF REPAIR Left 2012   left     Current Outpatient Medications:    terbinafine (LAMISIL) 250 MG tablet, Take 1 tablet (250 mg total) by mouth daily., Disp: 90 tablet, Rfl: 0   amLODipine (NORVASC) 10 MG tablet, Take 1 tablet (10 mg total) by mouth daily., Disp: 90 tablet, Rfl: 1   atorvastatin (LIPITOR) 80 MG tablet, Take 1 tablet (80 mg total) by mouth daily., Disp: 90 tablet, Rfl: 3   hydrochlorothiazide (HYDRODIURIL) 25 MG tablet, Take 1 tablet (25 mg total) by mouth daily., Disp: 90 tablet, Rfl: 1   lisinopril (ZESTRIL) 40 MG tablet, Take 1 tablet (40 mg total) by mouth daily., Disp: 90 tablet, Rfl: 1   meloxicam (MOBIC) 15 MG tablet, Take 1 tablet (15 mg total) by mouth daily., Disp: 90 tablet, Rfl: 1   metFORMIN (GLUCOPHAGE) 1000 MG tablet, Take 1 tablet (1,000 mg total) by mouth daily with breakfast., Disp: 90 tablet, Rfl: 1   mupirocin ointment (BACTROBAN) 2 %, Apply 1 Application topically 2 (two) times daily., Disp: 22 g, Rfl: 0   tamsulosin (FLOMAX) 0.4 MG CAPS capsule, Take 1 capsule (0.4 mg total) by mouth  daily., Disp: 90 capsule, Rfl: 1  Allergies  Allergen Reactions   Morphine And Related Other (See Comments)    Morphine only- sweating, flushing.            Objective:  Physical Exam  General: AAO x3, NAD  Dermatological: Nails are hypertrophic, dystrophic with yellow-brown discoloration with subungual debris.  There is no nails.  There is no drainage or pus.  No ascending cellulitis.  There is no malodor present.  Vascular: Dorsalis Pedis artery and Posterior Tibial artery pedal pulses are 2/4 bilateral with immedate capillary fill time. There is no pain with calf compression, swelling, warmth, erythema.   Neruologic: Grossly intact via light touch bilateral.  Musculoskeletal: No gross boney pedal deformities bilateral. No pain, crepitus, or limitation noted with foot and ankle range of motion bilateral. Muscular strength 5/5 in all groups tested bilateral.  Gait: Unassisted, Nonantalgic.       Assessment:   Symptomatic onychomycosis     Plan:  -Treatment options discussed including all alternatives, risks, and complications -Etiology of symptoms were discussed -Discussed treatment options for nail fungus including oral, topical as well as alternative treatments.  At this time the patient wants to proceed with oral Lamisil.  We discussed side effects of the medication and success rates.  We  will check a CBC and LFT prior to starting the medication.  Once I receive the results of this I will then call the medication in.  -Discussed daily foot inspection.  Trula Slade DPM

## 2022-06-29 DIAGNOSIS — Z79899 Other long term (current) drug therapy: Secondary | ICD-10-CM | POA: Diagnosis not present

## 2022-06-30 LAB — HEPATIC FUNCTION PANEL
ALT: 32 IU/L (ref 0–44)
AST: 26 IU/L (ref 0–40)
Albumin: 4.4 g/dL (ref 3.8–4.9)
Alkaline Phosphatase: 92 IU/L (ref 44–121)
Bilirubin Total: 0.6 mg/dL (ref 0.0–1.2)
Bilirubin, Direct: 0.17 mg/dL (ref 0.00–0.40)
Total Protein: 7 g/dL (ref 6.0–8.5)

## 2022-06-30 LAB — CBC WITH DIFFERENTIAL/PLATELET
Basophils Absolute: 0.1 10*3/uL (ref 0.0–0.2)
Basos: 1 %
EOS (ABSOLUTE): 0.2 10*3/uL (ref 0.0–0.4)
Eos: 2 %
Hematocrit: 43.6 % (ref 37.5–51.0)
Hemoglobin: 14.9 g/dL (ref 13.0–17.7)
Immature Grans (Abs): 0.1 10*3/uL (ref 0.0–0.1)
Immature Granulocytes: 1 %
Lymphocytes Absolute: 3.1 10*3/uL (ref 0.7–3.1)
Lymphs: 33 %
MCH: 30.8 pg (ref 26.6–33.0)
MCHC: 34.2 g/dL (ref 31.5–35.7)
MCV: 90 fL (ref 79–97)
Monocytes Absolute: 0.8 10*3/uL (ref 0.1–0.9)
Monocytes: 8 %
Neutrophils Absolute: 5.4 10*3/uL (ref 1.4–7.0)
Neutrophils: 55 %
Platelets: 268 10*3/uL (ref 150–450)
RBC: 4.83 x10E6/uL (ref 4.14–5.80)
RDW: 13.5 % (ref 11.6–15.4)
WBC: 9.6 10*3/uL (ref 3.4–10.8)

## 2022-07-04 DIAGNOSIS — R931 Abnormal findings on diagnostic imaging of heart and coronary circulation: Secondary | ICD-10-CM | POA: Insufficient documentation

## 2022-07-04 NOTE — Progress Notes (Unsigned)
Cardiology Office Note   Date:  07/06/2022   ID:  Ryan Fisher, DOB October 29, 1970, MRN [id number]  PCP:  Natalia Leatherwood, DO  Cardiologist:   None Referring:  Natalia Leatherwood, DO  Chief Complaint  Patient presents with   Coronary Artery Disease     History of Present Illness: Ryan Fisher is a 52 y.o. male who presents for evaluation of elevated coronary calcium and aortic atherosclerosis.   He is referred by Felix Pacini A, DO .   He has not had any prior cardiac history.  He does have risk factors to include dyslipidemia, hypertension and elevated blood sugars.  He has not had any prior cardiac testing.  He is not overly physically active though he will walk up stairs at work routinely.  He walks in his yard maybe 100 yards up an incline.  He might get winded but he says this is only slowly progressive.  He is not having any new resting shortness of breath and denies PND or orthopnea.  He does not have any chest pressure, neck or arm discomfort.  He has had no new edema.       Past Medical History:  Diagnosis Date   Hyperlipidemia    Hypertension     Past Surgical History:  Procedure Laterality Date   COLONOSCOPY     ROTATOR CUFF REPAIR Left 2012   left     Current Outpatient Medications  Medication Sig Dispense Refill   amLODipine (NORVASC) 10 MG tablet Take 1 tablet (10 mg total) by mouth daily. 90 tablet 1   atorvastatin (LIPITOR) 80 MG tablet Take 1 tablet (80 mg total) by mouth daily. 90 tablet 3   hydrochlorothiazide (HYDRODIURIL) 25 MG tablet Take 1 tablet (25 mg total) by mouth daily. 90 tablet 1   lisinopril (ZESTRIL) 40 MG tablet Take 1 tablet (40 mg total) by mouth daily. 90 tablet 1   meloxicam (MOBIC) 15 MG tablet Take 1 tablet (15 mg total) by mouth daily. 90 tablet 1   metFORMIN (GLUCOPHAGE) 1000 MG tablet Take 1 tablet (1,000 mg total) by mouth daily with breakfast. 90 tablet 1   mupirocin ointment (BACTROBAN) 2 % Apply 1 Application topically 2 (two) times  daily. 22 g 0   tamsulosin (FLOMAX) 0.4 MG CAPS capsule Take 1 capsule (0.4 mg total) by mouth daily. 90 capsule 1   terbinafine (LAMISIL) 250 MG tablet Take 1 tablet (250 mg total) by mouth daily. 90 tablet 0   No current facility-administered medications for this visit.    Allergies:   Morphine and related    Social History:  The patient  reports that he has quit smoking. His smoking use included cigarettes and cigars. He started smoking about 28 years ago. He has a 22.00 pack-year smoking history. He quit smokeless tobacco use about 13 years ago.  His smokeless tobacco use included snuff. He reports current alcohol use of about 6.0 standard drinks of alcohol per week. He reports that he does not use drugs.   Family History:  The patient's family history includes Allergies in his brother; Arthritis in his mother; Asthma in his brother; Atrial fibrillation in his mother; Brain cancer in his maternal aunt; Early death in his paternal uncle; Heart failure in his paternal grandfather; Osteoporosis in his mother.    ROS:  Please see the history of present illness.   Otherwise, review of systems are positive for none.   All other systems are reviewed and negative.  PHYSICAL EXAM: VS:  BP 118/80   Pulse 73   Ht  (1.88 m)   Wt (!) 301 lb (136.5 kg)   BMI 38.65 kg/m  , BMI Body mass index is 38.65 kg/m. GENERAL:  Well appearing HEENT:  Pupils equal round and reactive, fundi not visualized, oral mucosa unremarkable NECK:  No jugular venous distention, waveform within normal limits, carotid upstroke brisk and symmetric, no bruits, no thyromegaly LYMPHATICS:  No cervical, inguinal adenopathy LUNGS:  Clear to auscultation bilaterally BACK:  No CVA tenderness CHEST:  Unremarkable HEART:  PMI not displaced or sustained,S1 and S2 within normal limits, no S3, no S4, no clicks, no rubs, no murmurs ABD:  Flat, positive bowel sounds normal in frequency in pitch, no bruits, no rebound, no  guarding, no midline pulsatile mass, no hepatomegaly, no splenomegaly EXT:  2 plus pulses throughout, no edema, no cyanosis no clubbing SKIN:  No rashes no nodules NEURO:  Cranial nerves II through XII grossly intact, motor grossly intact throughout PSYCH:  Cognitively intact, oriented to person place and time    EKG:  EKG is ordered today. The ekg ordered today demonstrates sinus rhythm, rate 73, axis within normal limits, intervals within normal limits, poor anterior R wave progression.   Recent Labs: 05/02/2022: BUN 12; Creat 1.15; Potassium 4.4; Sodium 141; TSH 2.48 06/29/2022: ALT 32; Hemoglobin 14.9; Platelets 268    Lipid Panel    Component Value Date/Time   CHOL 138 05/02/2022 1531   TRIG 138 05/02/2022 1531   HDL 40 05/02/2022 1531   CHOLHDL 3.5 05/02/2022 1531   VLDL 17.8 05/04/2021 0919   LDLCALC 76 05/02/2022 1531      Wt Readings from Last 3 Encounters:  07/06/22 (!) 301 lb (136.5 kg)  05/02/22 290 lb 6.4 oz (131.7 kg)  12/29/21 288 lb 3.2 oz (130.7 kg)      Other studies Reviewed: Additional studies/ records that were reviewed today include: Labs. Review of the above records demonstrates:  Please see elsewhere in the note.     ASSESSMENT AND PLAN:  Aortic atherosclerosis: This will be managed as below with risk reduction.  Elevated coronary calcium: Given this and some dyspnea I would like to screen him with a POET (Plain Old Exercise Treadmill)  Obesity: We talked a lot about diet and exercise and I prescribed a Mediterranean plant-based diet  Dyslipidemia: Earlier this year his LDL was 43 and I agree with a goal LDL of 58 and he said he had his lipid medicines adjusted.  I will defer to his PCP.  Hypertension: Blood pressure is at target.  Continue the meds as listed.   Current medicines are reviewed at length with the patient today.  The patient does not have concerns regarding medicines.  The following changes have been made:  no change  Labs/  tests ordered today include:   Orders Placed This Encounter  Procedures   EXERCISE TOLERANCE TEST (ETT)   EKG 12-Lead     Disposition:   FU with me as needed and based on the results of the above.   Signed, Rollene Rotunda, MD  07/06/2022 2:37 PM    [facility]

## 2022-07-06 ENCOUNTER — Ambulatory Visit: Payer: BC Managed Care – PPO | Admitting: Cardiology

## 2022-07-06 ENCOUNTER — Encounter: Payer: Self-pay | Admitting: Cardiology

## 2022-07-06 VITALS — BP 118/80 | HR 73 | Ht 74.0 in | Wt 301.0 lb

## 2022-07-06 DIAGNOSIS — I700 Atherosclerosis of aorta: Secondary | ICD-10-CM | POA: Diagnosis not present

## 2022-07-06 DIAGNOSIS — R931 Abnormal findings on diagnostic imaging of heart and coronary circulation: Secondary | ICD-10-CM | POA: Diagnosis not present

## 2022-07-06 NOTE — Patient Instructions (Signed)
Medication Instructions:  The current medical regimen is effective;  continue present plan and medications.  *If you need a refill on your cardiac medications before your next appointment, please call your pharmacy*  Testing/Procedures: Your physician has requested that you have an exercise tolerance test. For further information please visit [email or web address] Please also follow instruction sheet, as given.  Follow-Up: At [facility], you and your health needs are our priority.  As part of our continuing mission to provide you with exceptional heart care, we have created designated Provider Care Teams.  These Care Teams include your primary Cardiologist (physician) and Advanced Practice Providers (APPs -  Physician Assistants and Nurse Practitioners) who all work together to provide you with the care you need, when you need it.  We recommend signing up for the patient portal called "MyChart".  Sign up information is provided on this After Visit Summary.  MyChart is used to connect with patients for Virtual Visits (Telemedicine).  Patients are able to view lab/test results, encounter notes, upcoming appointments, etc.  Non-urgent messages can be sent to your provider as well.   To learn more about what you can do with MyChart, go to [email or web address].    Your next appointment:   1 year(s)  Provider:   Rollene Rotunda, MD

## 2022-07-19 ENCOUNTER — Ambulatory Visit: Payer: BC Managed Care – PPO | Attending: Cardiology

## 2022-07-19 DIAGNOSIS — I700 Atherosclerosis of aorta: Secondary | ICD-10-CM | POA: Diagnosis not present

## 2022-07-19 DIAGNOSIS — R931 Abnormal findings on diagnostic imaging of heart and coronary circulation: Secondary | ICD-10-CM

## 2022-07-19 LAB — EXERCISE TOLERANCE TEST
Angina Index: 0
Duke Treadmill Score: 8
Estimated workload: 10.1
Exercise duration (min): 8 min
Exercise duration (sec): 0 s
MPHR: 169 {beats}/min
Peak HR: 155 {beats}/min
Percent HR: 91 %
RPE: 17
Rest HR: 68 {beats}/min
ST Depression (mm): 0 mm

## 2022-08-18 ENCOUNTER — Ambulatory Visit: Payer: BC Managed Care – PPO | Admitting: Podiatry

## 2022-08-18 DIAGNOSIS — Z79899 Other long term (current) drug therapy: Secondary | ICD-10-CM

## 2022-08-18 DIAGNOSIS — B351 Tinea unguium: Secondary | ICD-10-CM | POA: Diagnosis not present

## 2022-08-18 NOTE — Progress Notes (Signed)
Subjective: Chief Complaint  Patient presents with   Nail Problem    Nail trim    52 year old male presents for follow-up evaluation of nail fungus, currently on Lamisil.  Denies any side effects of the medication.  No significant pain to the nails.  No other concerns  Objective: AAO x3, NAD DP/PT pulses palpable bilaterally, CRT less than 3 seconds No skin to be hypertrophic, dystrophic with yellow discoloration but there is clearing of the proximal nail folds.  No pain in the nails.  No edema, erythema or signs of infection. No pain with calf compression, swelling, warmth, erythema  Assessment: Onychomycosis, currently on Lamisil  Plan: -All treatment options discussed with the patient including all alternatives, risks, complications.  -Sharply debridement nails x 10 without any complications or bleeding.  She is tolerating Lamisil well without any side effects.  Will likely continue 30 more days of the medication.  Blood work was ordered today and will extend Lamisil if normal. -Patient encouraged to call the office with any questions, concerns, change in symptoms.   Vivi Barrack DPM

## 2022-09-06 DIAGNOSIS — Z79899 Other long term (current) drug therapy: Secondary | ICD-10-CM | POA: Diagnosis not present

## 2022-09-07 LAB — HEPATIC FUNCTION PANEL
ALT: 35 IU/L (ref 0–44)
AST: 31 IU/L (ref 0–40)
Albumin: 4.4 g/dL (ref 3.8–4.9)
Alkaline Phosphatase: 70 IU/L (ref 44–121)
Bilirubin Total: 0.5 mg/dL (ref 0.0–1.2)
Bilirubin, Direct: 0.18 mg/dL (ref 0.00–0.40)
Total Protein: 7.5 g/dL (ref 6.0–8.5)

## 2022-09-07 LAB — CBC WITH DIFFERENTIAL/PLATELET
Basophils Absolute: 0.1 10*3/uL (ref 0.0–0.2)
Basos: 1 %
EOS (ABSOLUTE): 0.2 10*3/uL (ref 0.0–0.4)
Eos: 2 %
Hematocrit: 43.6 % (ref 37.5–51.0)
Hemoglobin: 14.7 g/dL (ref 13.0–17.7)
Immature Grans (Abs): 0 10*3/uL (ref 0.0–0.1)
Immature Granulocytes: 0 %
Lymphocytes Absolute: 3.2 10*3/uL — ABNORMAL HIGH (ref 0.7–3.1)
Lymphs: 33 %
MCH: 30.9 pg (ref 26.6–33.0)
MCHC: 33.7 g/dL (ref 31.5–35.7)
MCV: 92 fL (ref 79–97)
Monocytes Absolute: 0.7 10*3/uL (ref 0.1–0.9)
Monocytes: 7 %
Neutrophils Absolute: 5.3 10*3/uL (ref 1.4–7.0)
Neutrophils: 57 %
Platelets: 279 10*3/uL (ref 150–450)
RBC: 4.75 x10E6/uL (ref 4.14–5.80)
RDW: 13.2 % (ref 11.6–15.4)
WBC: 9.4 10*3/uL (ref 3.4–10.8)

## 2022-09-09 ENCOUNTER — Other Ambulatory Visit: Payer: Self-pay | Admitting: Podiatry

## 2022-09-09 MED ORDER — TERBINAFINE HCL 250 MG PO TABS: 250.0000 mg | ORAL_TABLET | Freq: Every day | ORAL | 0 refills | Status: DC

## 2022-10-17 ENCOUNTER — Ambulatory Visit: Payer: BC Managed Care – PPO | Admitting: Family Medicine

## 2022-10-17 VITALS — BP 104/68 | HR 78 | Temp 98.1°F | Wt 292.8 lb

## 2022-10-17 DIAGNOSIS — R931 Abnormal findings on diagnostic imaging of heart and coronary circulation: Secondary | ICD-10-CM

## 2022-10-17 DIAGNOSIS — R351 Nocturia: Secondary | ICD-10-CM

## 2022-10-17 DIAGNOSIS — Z7984 Long term (current) use of oral hypoglycemic drugs: Secondary | ICD-10-CM

## 2022-10-17 DIAGNOSIS — I2510 Atherosclerotic heart disease of native coronary artery without angina pectoris: Secondary | ICD-10-CM

## 2022-10-17 DIAGNOSIS — I10 Essential (primary) hypertension: Secondary | ICD-10-CM

## 2022-10-17 DIAGNOSIS — E1169 Type 2 diabetes mellitus with other specified complication: Secondary | ICD-10-CM | POA: Diagnosis not present

## 2022-10-17 DIAGNOSIS — E785 Hyperlipidemia, unspecified: Secondary | ICD-10-CM

## 2022-10-17 DIAGNOSIS — N401 Enlarged prostate with lower urinary tract symptoms: Secondary | ICD-10-CM

## 2022-10-17 LAB — POCT GLYCOSYLATED HEMOGLOBIN (HGB A1C)
HbA1c POC (<> result, manual entry): 5.5 % (ref 4.0–5.6)
HbA1c, POC (controlled diabetic range): 5.5 % (ref 0.0–7.0)
HbA1c, POC (prediabetic range): 5.5 % — AB (ref 5.7–6.4)
Hemoglobin A1C: 5.5 % (ref 4.0–5.6)

## 2022-10-17 MED ORDER — TAMSULOSIN HCL 0.4 MG PO CAPS: 0.4000 mg | ORAL_CAPSULE | Freq: Every day | ORAL | 1 refills | Status: DC

## 2022-10-17 MED ORDER — AMLODIPINE BESYLATE 10 MG PO TABS: 10.0000 mg | ORAL_TABLET | Freq: Every day | ORAL | 1 refills | Status: DC

## 2022-10-17 MED ORDER — METFORMIN HCL 1000 MG PO TABS: 1000.0000 mg | ORAL_TABLET | Freq: Every day | ORAL | 1 refills | Status: DC

## 2022-10-17 MED ORDER — LISINOPRIL 40 MG PO TABS: 40.0000 mg | ORAL_TABLET | Freq: Every day | ORAL | 1 refills | Status: DC

## 2022-10-17 MED ORDER — MELOXICAM 15 MG PO TABS: 15.0000 mg | ORAL_TABLET | Freq: Every day | ORAL | 1 refills | Status: DC

## 2022-10-17 MED ORDER — HYDROCHLOROTHIAZIDE 25 MG PO TABS: 25.0000 mg | ORAL_TABLET | Freq: Every day | ORAL | 1 refills | Status: DC

## 2022-10-17 NOTE — Progress Notes (Signed)
Patient ID: Ryan Fisher, male  DOB: May 01, 1970, 52 y.o.   MRN: [id number] Patient Care Team    Relationship Specialty Notifications Start End  Natalia Leatherwood, DO PCP - General Family Medicine  08/30/16   Delora Fuel, OD  Optometry  08/04/20   Glyn Ade, PA-C Physician Assistant Dermatology  01/27/21   Shellia Cleverly, DO Consulting Physician Gastroenterology  05/02/22     Chief Complaint  Patient presents with   Hypertension    Subjective: Ryan Fisher is a 52 y.o. male present for Chronic Conditions/illness Management All past medical history, surgical history, allergies, family history, immunizations, medications and social history were updated in the electronic medical record today. All recent labs, ED visits and hospitalizations within the last year were reviewed.   diabetes type 2/morbid obesity: Diagnosed 04/06/2020 Pt reports compliance with metformin 1000 mg twice daily.  Patient denies dizziness, hyperglycemic or hypoglycemic events. Patient denies numbness, tingling in the extremities or nonhealing wounds of feet.    Hypertension/HLD/Obesity:  Pt reports compliance with lisinopril 40 mg daily and amlodipine 10 mg daily. Blood pressures ranges at home are WNL. Patient denies chest pain, shortness of breath, dizziness or lower extremity edema.  Pt does not take a daily baby ASA. Pt is  taking his statin daily.  Labs due Diet: low sodium Exercise: not routinely RF: HTN, obesity, Fhx HD.  Daily smoker.    BPH: Pt reports his condition is stable with flomax use      05/02/2022    3:30 PM 12/30/2020    2:45 PM 04/01/2020    2:26 PM 09/06/2016   10:34 AM 08/30/2016    1:59 PM  Depression screen PHQ 2/9  Decreased Interest 0 0 0 0 0  Down, Depressed, Hopeless 0 0 0 0 0  PHQ - 2 Score 0 0 0 0 0      05/02/2022    3:30 PM  GAD 7 : Generalized Anxiety Score  Nervous, Anxious, on Edge 0  Control/stop worrying 0  Worry too much - different things 0   Trouble relaxing 0  Restless 0  Easily annoyed or irritable 0  Afraid - awful might happen 0  Total GAD 7 Score 0          05/02/2022    3:30 PM 09/06/2016   10:34 AM  Fall Risk   Falls in the past year? 0 No  Risk for fall due to : No Fall Risks   Follow up Falls evaluation completed     Immunization History  Administered Date(s) Administered   Influenza,inj,Quad PF,6+ Mos 11/29/2018, 12/30/2020, 11/08/2021   Influenza-Unspecified 01/11/2020   PFIZER(Purple Top)SARS-COV-2 Vaccination 06/10/2019, 07/02/2019, 02/19/2020   Pneumococcal Polysaccharide-23 09/06/2016   Tdap 03/14/2013   Zoster Recombinant(Shingrix) 05/04/2021, 07/26/2021   Past Medical History:  Diagnosis Date   Hyperlipidemia    Hypertension    Allergies  Allergen Reactions   Morphine And Codeine Other (See Comments)    Morphine only- sweating, flushing.    Past Surgical History:  Procedure Laterality Date   COLONOSCOPY     ROTATOR CUFF REPAIR Left 2012   left   Family History  Problem Relation Age of Onset   Arthritis Mother    Atrial fibrillation Mother    Osteoporosis Mother    Allergies Brother    Asthma Brother    Brain cancer Maternal Aunt    Early death Paternal Uncle    Heart failure Paternal Grandfather  Colon polyps Neg Hx    Colon cancer Neg Hx    Esophageal cancer Neg Hx    Stomach cancer Neg Hx    Rectal cancer Neg Hx    Social History   Social History Narrative   Married to [employer].    Some college attendance. Works for [employer] as an Engineer, petroleum.    Drinks caffeine.    Wears a seatbelt and bicycle helmet.    Smoke detector in the home. Firearms locked in the home.    Feels safe in his relationships.     Allergies as of 10/17/2022       Reactions   Morphine And Codeine Other (See Comments)   Morphine only- sweating, flushing.         Medication List        Accurate as of October 17, 2022  2:56 PM. If you have any questions, ask your nurse or doctor.           STOP taking these medications    mupirocin ointment 2 % Commonly known as: BACTROBAN Stopped by: Felix Pacini       TAKE these medications    amLODipine 10 MG tablet Commonly known as: NORVASC Take 1 tablet (10 mg total) by mouth daily.   atorvastatin 80 MG tablet Commonly known as: LIPITOR Take 1 tablet (80 mg total) by mouth daily.   hydrochlorothiazide 25 MG tablet Commonly known as: HYDRODIURIL Take 1 tablet (25 mg total) by mouth daily.   lisinopril 40 MG tablet Commonly known as: ZESTRIL Take 1 tablet (40 mg total) by mouth daily.   meloxicam 15 MG tablet Commonly known as: MOBIC Take 1 tablet (15 mg total) by mouth daily.   metFORMIN 1000 MG tablet Commonly known as: GLUCOPHAGE Take 1 tablet (1,000 mg total) by mouth daily with breakfast.   tamsulosin 0.4 MG Caps capsule Commonly known as: FLOMAX Take 1 capsule (0.4 mg total) by mouth daily.   terbinafine 250 MG tablet Commonly known as: LamISIL Take 1 tablet (250 mg total) by mouth daily.       All past medical history, surgical history, allergies, family history, immunizations andmedications were updated in the EMR today and reviewed under the history and medication portions of their EMR.       ROS 14 pt review of systems performed and negative (unless mentioned in an HPI)  Objective: BP 104/68   Pulse 78   Temp 98.1 F (36.7 C)   Wt 292 lb 12.8 oz (132.8 kg)   SpO2 97%   BMI 37.59 kg/m  Physical Exam Vitals and nursing note reviewed.  Constitutional:      General: He is not in acute distress.    Appearance: Normal appearance. He is not ill-appearing, toxic-appearing or diaphoretic.  HENT:     Head: Normocephalic and atraumatic.  Eyes:     General: No scleral icterus.       Right eye: No discharge.        Left eye: No discharge.     Extraocular Movements: Extraocular movements intact.     Pupils: Pupils are equal, round, and reactive to light.  Cardiovascular:     Rate and  Rhythm: Normal rate and regular rhythm.  Pulmonary:     Effort: Pulmonary effort is normal. No respiratory distress.     Breath sounds: Normal breath sounds. No wheezing, rhonchi or rales.  Musculoskeletal:     Right lower leg: No edema.     Left lower leg:  No edema.  Skin:    General: Skin is warm.     Findings: No rash.  Neurological:     Mental Status: He is alert and oriented to person, place, and time. Mental status is at baseline.  Psychiatric:        Mood and Affect: Mood normal.        Behavior: Behavior normal.        Thought Content: Thought content normal.        Judgment: Judgment normal.     No results found.  Assessment/plan: Ryan Fisher is a 52 y.o. male present for chronic condition management type 2 diabetes mellitus (HCC) with HLD - stable Continue  metformin 1000 mg daily -Continue statin -DC Januvia 100 mg daily- doing great.  -Goal A1c <7, closer to 6.5 would be best -Patient was instructed on normal glucose range and glucose testing. -Patient was educated on diabetic diet and glycemic index goals. PNA series: Completed 09/06/2016 Flu shot: UTD 2023 (recommneded yearly) Foot exam: Completed 05/02/2022 Eye exam: Up-to-date 05/02/2022 A1c: 9.1>5.3> 6.0> 6.1 > 6.3>5.5 collected today  Microalb UTD 04/2022  Essential hypertension/Morbid obesity (HCC)/HLD/Obesity, Class II, BMI 35-39.9/elevated coronary calcium score Stable Continue  amlodipine to 10 mg Continue lisinopril to 40 mg QD.  Continue  HCTZ 25 mg qd   -He was counseled on low-sodium diet, routine exercise and routine follow-up. - continue atorvastatin  BPH:  Stable Continue flomax   Lumbar DDD: Stable Continue Mobic 15 mg daily  Return in about 7 months (around 05/05/2023) for cpe (20 min), Routine chronic condition follow-up.   Orders Placed This Encounter  Procedures   POCT HgB A1C   Meds ordered this encounter  Medications   amLODipine (NORVASC) 10 MG tablet    Sig: Take 1  tablet (10 mg total) by mouth daily.    Dispense:  90 tablet    Refill:  1   lisinopril (ZESTRIL) 40 MG tablet    Sig: Take 1 tablet (40 mg total) by mouth daily.    Dispense:  90 tablet    Refill:  1   hydrochlorothiazide (HYDRODIURIL) 25 MG tablet    Sig: Take 1 tablet (25 mg total) by mouth daily.    Dispense:  90 tablet    Refill:  1   meloxicam (MOBIC) 15 MG tablet    Sig: Take 1 tablet (15 mg total) by mouth daily.    Dispense:  90 tablet    Refill:  1   metFORMIN (GLUCOPHAGE) 1000 MG tablet    Sig: Take 1 tablet (1,000 mg total) by mouth daily with breakfast.    Dispense:  90 tablet    Refill:  1   tamsulosin (FLOMAX) 0.4 MG CAPS capsule    Sig: Take 1 capsule (0.4 mg total) by mouth daily.    Dispense:  90 capsule    Refill:  1   Referral Orders  No referral(s) requested today      Note is dictated utilizing voice recognition software. Although note has been proof read prior to signing, occasional typographical errors still can be missed. If any questions arise, please do not hesitate to call for verification.  Electronically signed by: Felix Pacini, DO [facility]

## 2022-10-17 NOTE — Patient Instructions (Signed)
Return in about 7 months (around 05/05/2023) for cpe (20 min), Routine chronic condition follow-up.        Great to see you today.  I have refilled the medication(s) we provide.   If labs were collected or images ordered, we will inform you of  results once we have received them and reviewed. We will contact you either by echart message, or telephone call.  Please give ample time to the testing facility, and our office to run,  receive and review results. Please do not call inquiring of results, even if you can see them in your chart. We will contact you as soon as we are able. If it has been over 1 week since the test was completed, and you have not yet heard from [place], then please call us.    - echart message- for normal results that have been seen by the patient already.   - telephone call: abnormal results or if patient has not viewed results in their echart.  If a referral to a specialist was entered for you, please call us in 2 weeks if you have not heard from the specialist office to schedule. '

## 2023-05-04 LAB — HM DIABETES EYE EXAM

## 2023-05-17 ENCOUNTER — Encounter: Payer: BC Managed Care – PPO | Admitting: Family Medicine

## 2023-06-20 ENCOUNTER — Encounter: Payer: Self-pay | Admitting: Family Medicine

## 2023-06-20 ENCOUNTER — Ambulatory Visit: Admitting: Family Medicine

## 2023-06-20 VITALS — BP 128/84 | HR 70 | Temp 98.1°F | Ht 74.0 in | Wt 297.4 lb

## 2023-06-20 DIAGNOSIS — I10 Essential (primary) hypertension: Secondary | ICD-10-CM

## 2023-06-20 DIAGNOSIS — Z125 Encounter for screening for malignant neoplasm of prostate: Secondary | ICD-10-CM

## 2023-06-20 DIAGNOSIS — N401 Enlarged prostate with lower urinary tract symptoms: Secondary | ICD-10-CM | POA: Diagnosis not present

## 2023-06-20 DIAGNOSIS — R351 Nocturia: Secondary | ICD-10-CM

## 2023-06-20 DIAGNOSIS — I2510 Atherosclerotic heart disease of native coronary artery without angina pectoris: Secondary | ICD-10-CM

## 2023-06-20 DIAGNOSIS — E1169 Type 2 diabetes mellitus with other specified complication: Secondary | ICD-10-CM | POA: Diagnosis not present

## 2023-06-20 DIAGNOSIS — Z23 Encounter for immunization: Secondary | ICD-10-CM

## 2023-06-20 DIAGNOSIS — F17210 Nicotine dependence, cigarettes, uncomplicated: Secondary | ICD-10-CM

## 2023-06-20 DIAGNOSIS — Z0000 Encounter for general adult medical examination without abnormal findings: Secondary | ICD-10-CM

## 2023-06-20 DIAGNOSIS — F17200 Nicotine dependence, unspecified, uncomplicated: Secondary | ICD-10-CM

## 2023-06-20 DIAGNOSIS — R931 Abnormal findings on diagnostic imaging of heart and coronary circulation: Secondary | ICD-10-CM

## 2023-06-20 DIAGNOSIS — I700 Atherosclerosis of aorta: Secondary | ICD-10-CM

## 2023-06-20 DIAGNOSIS — M5136 Other intervertebral disc degeneration, lumbar region with discogenic back pain only: Secondary | ICD-10-CM

## 2023-06-20 DIAGNOSIS — E785 Hyperlipidemia, unspecified: Secondary | ICD-10-CM | POA: Diagnosis not present

## 2023-06-20 DIAGNOSIS — Z122 Encounter for screening for malignant neoplasm of respiratory organs: Secondary | ICD-10-CM

## 2023-06-20 DIAGNOSIS — J432 Centrilobular emphysema: Secondary | ICD-10-CM

## 2023-06-20 LAB — LIPID PANEL
Cholesterol: 199 mg/dL (ref 0–200)
HDL: 36.5 mg/dL — ABNORMAL LOW (ref >39.00)
LDL Cholesterol: 136 mg/dL — ABNORMAL HIGH (ref 0–99)
NonHDL: 162.91
Total CHOL/HDL Ratio: 5
Triglycerides: 137 mg/dL (ref 0.0–149.0)
VLDL: 27.4 mg/dL (ref 0.0–40.0)

## 2023-06-20 LAB — CBC
HCT: 48.8 % (ref 39.0–52.0)
Hemoglobin: 16.8 g/dL (ref 13.0–17.0)
MCHC: 34.4 g/dL (ref 30.0–36.0)
MCV: 89.8 fl (ref 78.0–100.0)
Platelets: 271 10*3/uL (ref 150.0–400.0)
RBC: 5.44 Mil/uL (ref 4.22–5.81)
RDW: 14.2 % (ref 11.5–15.5)
WBC: 10.2 10*3/uL (ref 4.0–10.5)

## 2023-06-20 LAB — COMPREHENSIVE METABOLIC PANEL WITH GFR
ALT: 29 U/L (ref 0–53)
AST: 24 U/L (ref 0–37)
Albumin: 4.9 g/dL (ref 3.5–5.2)
Alkaline Phosphatase: 62 U/L (ref 39–117)
BUN: 14 mg/dL (ref 6–23)
CO2: 28 meq/L (ref 19–32)
Calcium: 10.2 mg/dL (ref 8.4–10.5)
Chloride: 101 meq/L (ref 96–112)
Creatinine, Ser: 1.12 mg/dL (ref 0.40–1.50)
GFR: 75.72 mL/min (ref >60.00)
Glucose, Bld: 106 mg/dL — ABNORMAL HIGH (ref 70–99)
Potassium: 4.7 meq/L (ref 3.5–5.1)
Sodium: 139 meq/L (ref 135–145)
Total Bilirubin: 0.9 mg/dL (ref 0.2–1.2)
Total Protein: 7.9 g/dL (ref 6.0–8.3)

## 2023-06-20 LAB — MICROALBUMIN / CREATININE URINE RATIO
Creatinine,U: 200.2 mg/dL
Microalb Creat Ratio: 72.9 mg/g — ABNORMAL HIGH (ref 0.0–30.0)
Microalb, Ur: 14.6 mg/dL — ABNORMAL HIGH (ref 0.0–1.9)

## 2023-06-20 LAB — PSA: PSA: 1.3 ng/mL (ref 0.10–4.00)

## 2023-06-20 LAB — HEMOGLOBIN A1C: Hgb A1c MFr Bld: 6.1 % (ref 4.6–6.5)

## 2023-06-20 LAB — TSH: TSH: 3.22 u[IU]/mL (ref 0.35–5.50)

## 2023-06-20 MED ORDER — ATORVASTATIN CALCIUM 80 MG PO TABS: 80.0000 mg | ORAL_TABLET | Freq: Every day | ORAL | 3 refills | Status: DC

## 2023-06-20 MED ORDER — HYDROCHLOROTHIAZIDE 25 MG PO TABS: 25.0000 mg | ORAL_TABLET | Freq: Every day | ORAL | 1 refills | Status: DC

## 2023-06-20 MED ORDER — METFORMIN HCL 1000 MG PO TABS: 1000.0000 mg | ORAL_TABLET | Freq: Every day | ORAL | 1 refills | Status: DC

## 2023-06-20 MED ORDER — LISINOPRIL 40 MG PO TABS: 40.0000 mg | ORAL_TABLET | Freq: Every day | ORAL | 1 refills | Status: DC

## 2023-06-20 MED ORDER — TAMSULOSIN HCL 0.4 MG PO CAPS: 0.4000 mg | ORAL_CAPSULE | Freq: Every day | ORAL | 1 refills | Status: DC

## 2023-06-20 MED ORDER — AMLODIPINE BESYLATE 10 MG PO TABS: 10.0000 mg | ORAL_TABLET | Freq: Every day | ORAL | 1 refills | Status: DC

## 2023-06-20 MED ORDER — MELOXICAM 15 MG PO TABS: 15.0000 mg | ORAL_TABLET | Freq: Every day | ORAL | 1 refills | Status: DC

## 2023-06-20 NOTE — Progress Notes (Signed)
 Patient ID: Ryan Fisher, male  DOB: Apr 28, 1970, 53 y.o.   MRN: [id number] Patient Care Team    Relationship Specialty Notifications Start End  Natalia Leatherwood, DO PCP - General Family Medicine  08/30/16   Delora Fuel, OD  Optometry  08/04/20   Glyn Ade, PA-C Physician Assistant Dermatology  01/27/21   Shellia Cleverly, DO Consulting Physician Gastroenterology  05/02/22     Chief Complaint  Patient presents with   Annual Exam    Chronic Conditions/illness Management     Subjective: Ryan Fisher is a 53 y.o. male present for Cpe and Chronic Conditions/illness Management All past medical history, surgical history, allergies, family history, immunizations, medications and social history were updated in the electronic medical record today. All recent labs, ED visits and hospitalizations within the last year were reviewed.  Health maintenance:  Colonoscopy: Colonoscopy completed 07/14/2021-3-year follow-up recommended. Dr. Barron Alvine Immunizations: tdap 03/2013-updated today, (encouraged yearly), Pneumovax due-updated today, Shingrix series completed Infectious disease screening: HIV completed, Hep C completed  PSA: no fhx.   Lab Results  Component Value Date   PSA 0.99 05/02/2022   PSA 0.91 05/04/2021   PSA 0.94 11/29/2018    diabetes type 2/morbid obesity: Diagnosed 04/06/2020 Pt reports compliance with metformin 1000 mg twice daily.  Patient denies dizziness, hyperglycemic or hypoglycemic events. Patient denies Patient denies dizziness, hyperglycemic or hypoglycemic events. Patient denies numbness, tingling in the extremities or nonhealing wounds of feet.   Hypertension/HLD/Obesity:  Pt reports compliance with lisinopril 40 mg daily and amlodipine 10 mg daily. Patient denies chest pain, shortness of breath, dizziness or lower extremity edema.  Pt does not take a daily baby ASA. Pt is  taking his statin daily.  Labs due Diet: low sodium Exercise: not routinely RF:  HTN, obesity, Fhx HD.  Daily smoker.    BPH: Pt reports his condition is stable with flomax use   Shared decision making visit for lung cancer screening: Patient was brought in today for a office visit concerning shared decision making for their lung cancer screening.[name] is a 53 y.o. male Patient is between the ages of 28-75: Yes Patient is a current smoker with at least 20 year pack year history or Patient is a former smoker, quit less than 15 years ago and has a 20 pack year history : Yes Patient has current symptoms: No Patient has a  health problem that substantially limits life expectancy or the ability or willingness to have curative lung surgery: No  Exercise tolerance test 07/19/2022   Patient exercised for 8 minutes achieve 10.1 METS of activity.   Occasional PVCs noted during stress.   No ST segment elevation or depression   Low risk exercise treadmill test with no electrocardiographic evidence of ischemia.     06/20/2023    1:55 PM 10/17/2022    3:52 PM 05/02/2022    3:30 PM 12/30/2020    2:45 PM 04/01/2020    2:26 PM  Depression screen PHQ 2/9  Decreased Interest 0 0 0 0 0  Down, Depressed, Hopeless 0 0 0 0 0  PHQ - 2 Score 0 0 0 0 0  Altered sleeping 1      Tired, decreased energy 0      Change in appetite 0      Feeling bad or failure about yourself  0      Trouble concentrating 0      Moving slowly or fidgety/restless 0  Suicidal thoughts 0      PHQ-9 Score 1      Difficult doing work/chores Not difficult at all          06/20/2023    1:55 PM 05/02/2022    3:30 PM  GAD 7 : Generalized Anxiety Score  Nervous, Anxious, on Edge 0 0  Control/stop worrying 0 0  Worry too much - different things 0 0  Trouble relaxing 1 0  Restless 0 0  Easily annoyed or irritable 0 0  Afraid - awful might happen 0 0  Total GAD 7 Score 1 0  Anxiety Difficulty Not difficult at all           06/20/2023    1:55 PM 10/17/2022    3:51 PM 05/02/2022    3:30 PM 09/06/2016    10:34 AM  Fall Risk   Falls in the past year? 0 0 0 No  Number falls in past yr:  0    Injury with Fall?  0    Risk for fall due to :  No Fall Risks No Fall Risks   Follow up Falls evaluation completed Falls evaluation completed Falls evaluation completed     Immunization History  Administered Date(s) Administered   Influenza,inj,Quad PF,6+ Mos 11/29/2018, 12/30/2020, 11/08/2021   Influenza-Unspecified 01/11/2020   PFIZER(Purple Top)SARS-COV-2 Vaccination 06/10/2019, 07/02/2019, 02/19/2020   PNEUMOCOCCAL CONJUGATE-20 06/20/2023   Pneumococcal Polysaccharide-23 09/06/2016   Tdap 03/14/2013, 06/20/2023   Zoster Recombinant(Shingrix) 05/04/2021, 07/26/2021   Past Medical History:  Diagnosis Date   Heel spur, right 04/26/2018   Hyperlipidemia    Hypertension    Allergies  Allergen Reactions   Morphine And Codeine Other (See Comments)    Morphine only- sweating, flushing.    Past Surgical History:  Procedure Laterality Date   COLONOSCOPY     ROTATOR CUFF REPAIR Left 2012   left   Family History  Problem Relation Age of Onset   Arthritis Mother    Atrial fibrillation Mother    Osteoporosis Mother    Allergies Brother    Asthma Brother    Brain cancer Maternal Aunt    Early death Paternal Uncle    Heart failure Paternal Grandfather    Colon polyps Neg Hx    Colon cancer Neg Hx    Esophageal cancer Neg Hx    Stomach cancer Neg Hx    Rectal cancer Neg Hx    Social History   Social History Narrative   Married to [place].    Some college attendance. Works for [employer] as an Engineer, petroleum.    Drinks caffeine.    Wears a seatbelt and bicycle helmet.    Smoke detector in the home. Firearms locked in the home.    Feels safe in his relationships.     Allergies as of 06/20/2023       Reactions   Morphine And Codeine Other (See Comments)   Morphine only- sweating, flushing.         Medication List        Accurate as of June 20, 2023  2:16 PM. If you have any  questions, ask your nurse or doctor.          STOP taking these medications    terbinafine 250 MG tablet Commonly known as: LamISIL Stopped by: Felix Pacini       TAKE these medications    amLODipine 10 MG tablet Commonly known as: NORVASC Take 1 tablet (10 mg total) by mouth daily.  atorvastatin 80 MG tablet Commonly known as: LIPITOR Take 1 tablet (80 mg total) by mouth daily.   hydrochlorothiazide 25 MG tablet Commonly known as: HYDRODIURIL Take 1 tablet (25 mg total) by mouth daily.   lisinopril 40 MG tablet Commonly known as: ZESTRIL Take 1 tablet (40 mg total) by mouth daily.   meloxicam 15 MG tablet Commonly known as: MOBIC Take 1 tablet (15 mg total) by mouth daily.   metFORMIN 1000 MG tablet Commonly known as: GLUCOPHAGE Take 1 tablet (1,000 mg total) by mouth daily with breakfast.   tamsulosin 0.4 MG Caps capsule Commonly known as: FLOMAX Take 1 capsule (0.4 mg total) by mouth daily.       All past medical history, surgical history, allergies, family history, immunizations andmedications were updated in the EMR today and reviewed under the history and medication portions of their EMR.       ROS 14 pt review of systems performed and negative (unless mentioned in an HPI)  Objective: BP 128/84   Pulse 70   Temp 98.1 F (36.7 C)   Ht 6\' 2"  (1.88 m)   Wt 297 lb 6.4 oz (134.9 kg)   SpO2 97%   BMI 38.18 kg/m  Physical Exam Constitutional:      General: He is not in acute distress.    Appearance: Normal appearance. He is obese. He is not ill-appearing, toxic-appearing or diaphoretic.  HENT:     Head: Normocephalic and atraumatic.     Right Ear: Tympanic membrane, ear canal and external ear normal. There is no impacted cerumen.     Left Ear: Tympanic membrane, ear canal and external ear normal. There is no impacted cerumen.     Nose: Nose normal. No congestion or rhinorrhea.     Mouth/Throat:     Mouth: Mucous membranes are moist.      Pharynx: Oropharynx is clear. No oropharyngeal exudate or posterior oropharyngeal erythema.  Eyes:     General: No scleral icterus.       Right eye: No discharge.        Left eye: No discharge.     Extraocular Movements: Extraocular movements intact.     Pupils: Pupils are equal, round, and reactive to light.  Cardiovascular:     Rate and Rhythm: Normal rate and regular rhythm.     Pulses: Normal pulses.     Heart sounds: Normal heart sounds. No murmur heard.    No friction rub. No gallop.  Pulmonary:     Effort: Pulmonary effort is normal. No respiratory distress.     Breath sounds: Normal breath sounds. No stridor. No wheezing, rhonchi or rales.  Chest:     Chest wall: No tenderness.  Abdominal:     General: Abdomen is flat. Bowel sounds are normal. There is no distension.     Palpations: Abdomen is soft. There is no mass.     Tenderness: There is no abdominal tenderness. There is no right CVA tenderness, left CVA tenderness, guarding or rebound.     Hernia: No hernia is present.  Musculoskeletal:        General: No swelling or tenderness. Normal range of motion.     Cervical back: Normal range of motion and neck supple.     Right lower leg: No edema.     Left lower leg: No edema.  Lymphadenopathy:     Cervical: No cervical adenopathy.  Skin:    General: Skin is warm and dry.     Coloration:  Skin is not jaundiced.     Findings: No bruising, lesion or rash.  Neurological:     General: No focal deficit present.     Mental Status: He is alert and oriented to person, place, and time. Mental status is at baseline.     Cranial Nerves: No cranial nerve deficit.     Sensory: No sensory deficit.     Motor: No weakness.     Coordination: Coordination normal.     Gait: Gait normal.     Deep Tendon Reflexes: Reflexes normal.  Psychiatric:        Mood and Affect: Mood normal.        Behavior: Behavior normal.        Thought Content: Thought content normal.        Judgment: Judgment  normal.    Diabetic Foot Exam - Simple   Simple Foot Form Diabetic Foot exam was performed with the following findings: Yes 06/20/2023  2:05 PM  Visual Inspection No deformities, no ulcerations, no other skin breakdown bilaterally: Yes Sensation Testing Intact to touch and monofilament testing bilaterally: Yes Pulse Check Posterior Tibialis and Dorsalis pulse intact bilaterally: Yes Comments     No results found.  Assessment/plan: Markale Birdsell is a 53 y.o. male present for CPE and chronic condition management type 2 diabetes mellitus (HCC) with HLD Stable Continue metformin 1000 mg daily Continue Lipitor 80 mg  -DC Januvia 100 mg daily-cost -Goal A1c <7, closer to 6.5 would be best -Patient was instructed on normal glucose range and glucose testing. -Patient was educated on diabetic diet and glycemic index goals. PNA series: Completed 06/20/2023 Flu shot:  (recommneded yearly) Foot exam: Completed  06/20/2023 Eye exam: Up-to-date 2//2025    [facility].  A1c: 9.1>5.3> 6.0> 6.1 > 6.3>5.5 > A1c collected today  Microalb collected today  Essential hypertension/Morbid obesity (HCC)/HLD/Obesity, Class II, BMI 35-39.9/elevated coronary calcium score Stable Continue amlodipine to 10 mg Continue lisinopril to 40 mg QD.  Continue HCTZ 25 mg qd   -He was counseled on low-sodium diet, routine exercise and routine follow-up. - continue atorvastatin Coronary calcium score:There is aortic atherosclerosis, as well as atherosclerosis of the great vessels of the mediastinum and the coronary arteries, including calcified atherosclerotic plaque in the left main, left anterior descending and left circumflex coronary arteries. Exercise tolerance test 07/19/2022-normal study Lipitor 80 mg  BPH:  Stable Continue flomax   Lumbar DDD: Stable Continue Mobic 15 mg daily  Lung cancer screening/tobacco abuse/greater than 20-pack-year history Lung cancer screening completed 05/13/2022 at [facility] -Patient was counseled on lung cancer screening today.  Patient does meet criteria for lung cancer screening.  Patient would like to proceed with lung cancer screening.  Patient understands screening may warrant further studies or repeat studies if any abnormality is found.  Patient is agreeable. -Patient has had a recent kidney function test:Yes - CT CHEST LUNG CA SCREEN LOW DOSE W/O CM; Future - Follow-up upon screening results.   Return in about 25 weeks (around 12/12/2023) for Routine chronic condition follow-up.   Orders Placed This Encounter  Procedures   CT CHEST LUNG CA SCREEN LOW DOSE W/O CM   Tdap vaccine greater than or equal to 7yo IM   Pneumococcal conjugate vaccine 20-valent   CBC   Comprehensive metabolic panel with GFR   Hemoglobin A1c   Lipid panel   PSA   TSH   Urine Microalbumin w/creat. ratio   Meds ordered this encounter  Medications   amLODipine (NORVASC) 10 MG tablet    Sig: Take 1 tablet (10 mg total) by mouth daily.    Dispense:  90 tablet    Refill:  1   hydrochlorothiazide (HYDRODIURIL) 25 MG tablet    Sig: Take 1 tablet (25 mg total) by mouth daily.    Dispense:  90 tablet    Refill:  1   lisinopril (ZESTRIL) 40 MG tablet    Sig: Take 1 tablet (40 mg total) by mouth daily.    Dispense:  90 tablet    Refill:  1   meloxicam (MOBIC) 15 MG tablet    Sig: Take 1 tablet (15 mg total) by mouth daily.    Dispense:  90 tablet    Refill:  1   metFORMIN (GLUCOPHAGE) 1000 MG tablet    Sig: Take 1 tablet (1,000 mg total) by mouth daily with breakfast.    Dispense:  90 tablet    Refill:  1   tamsulosin (FLOMAX) 0.4 MG CAPS capsule    Sig: Take 1 capsule (0.4 mg total) by mouth daily.    Dispense:  90 capsule    Refill:  1   atorvastatin (LIPITOR) 80 MG tablet    Sig: Take 1 tablet (80 mg total) by mouth daily.    Dispense:  90 tablet    Refill:  3    Discontinue lower dose   Referral Orders  No referral(s) requested today       Note is dictated utilizing voice recognition software. Although note has been proof read prior to signing, occasional typographical errors still can be missed. If any questions arise, please do not hesitate to call for verification.  Electronically signed by: Felix Pacini, DO [facility] [place]

## 2023-06-20 NOTE — Patient Instructions (Addendum)

## 2023-06-21 ENCOUNTER — Telehealth: Payer: Self-pay | Admitting: Family Medicine

## 2023-06-21 MED ORDER — EMPAGLIFLOZIN 10 MG PO TABS: 10.0000 mg | ORAL_TABLET | Freq: Every day | ORAL | 1 refills | Status: DC

## 2023-06-21 NOTE — Telephone Encounter (Signed)
 Please call patient Blood cell counts, thyroid function, electrolytes liver function and kidney function are normal Prostate cancer screening is normal A1c went up to 6.1, but overall looks good. The bad cholesterol is above goal at 136, below 70 is goal.  His level was 76 last year.  Please confirm that he is taking the atorvastatin 80 mg nightly.  If he has been consistent with this medication then we will need to add another medication to his regimen to get his cholesterol at goal.  Please advise if he states he has been consistent or not.  His urine did have elevated levels of protein.  In order to help protect the kidneys and decrease the protein in the urine, I have discontinued his metformin and called in a medication called Jardiance.  Jardiance choose a diabetic medication, therefore I will take place of his metformin, but it also provides kidney protection and indicated for patients that have protein in the urine.   We will check his urine again at his follow-up appointment, please make sure he has an appointment scheduled for follow-up in 4-5 months

## 2023-06-22 NOTE — Telephone Encounter (Signed)
 MyChart message read.

## 2023-07-15 ENCOUNTER — Ambulatory Visit (HOSPITAL_BASED_OUTPATIENT_CLINIC_OR_DEPARTMENT_OTHER)
Admission: RE | Admit: 2023-07-15 | Discharge: 2023-07-15 | Disposition: A | Discharge status: Home / Self Care | Source: Ambulatory Visit | Attending: Family Medicine | Admitting: Family Medicine

## 2023-07-15 DIAGNOSIS — Z122 Encounter for screening for malignant neoplasm of respiratory organs: Secondary | ICD-10-CM | POA: Diagnosis not present

## 2023-07-15 DIAGNOSIS — F17200 Nicotine dependence, unspecified, uncomplicated: Secondary | ICD-10-CM | POA: Diagnosis not present

## 2023-07-15 DIAGNOSIS — F17210 Nicotine dependence, cigarettes, uncomplicated: Secondary | ICD-10-CM | POA: Diagnosis not present

## 2023-07-26 NOTE — Progress Notes (Unsigned)
  Cardiology Office Note:   Date:  07/27/2023  ID:  Ryan Fisher, DOB [birth year], MRN [id number] PCP: Mariel Shope, DO  [facility] Providers Cardiologist:  None {  History of Present Illness:   Ryan Fisher is a 53 y.o. male who presents for evaluation of elevated coronary calcium  and aortic atherosclerosis.  He had a negative POET (Plain Old Exercise Treadmill).   Since I last saw him he has done well.  The patient denies any new symptoms such as chest discomfort, neck or arm discomfort. There has been no new shortness of breath, PND or orthopnea. There have been no reported palpitations, presyncope or syncope.  He does have a physical job.  He has to lift some heavy machine parts.  With this he has no cardiovascular symptoms.  ROS: As stated in the HPI and negative for all other systems.  Studies Reviewed:    EKG:   EKG Interpretation Date/Time:  Thursday Jul 27 2023 16:17:55 EDT Ventricular Rate:  89 PR Interval:  174 QRS Duration:  90 QT Interval:  356 QTC Calculation: 433 R Axis:   19  Text Interpretation: Normal sinus rhythm Nonspecific T wave abnormality  No change compared to 07/06/22 ECG Confirmed by Eilleen Grates ([id number]) on 07/27/2023 4:37:19 PM    Risk Assessment/Calculations:              Physical Exam:   VS:  BP 134/88   Pulse 89   Ht 6\' 3"  (1.905 m)   Wt 296 lb (134.3 kg)   SpO2 96%   BMI 37.00 kg/m    Wt Readings from Last 3 Encounters:  07/27/23 296 lb (134.3 kg)  06/20/23 297 lb 6.4 oz (134.9 kg)  10/17/22 292 lb 12.8 oz (132.8 kg)     GEN: Well nourished, well developed in no acute distress NECK: No JVD; No carotid bruits CARDIAC: RRR, no murmurs, rubs, gallops RESPIRATORY:  Clear to auscultation without rales, wheezing or rhonchi  ABDOMEN: Soft, non-tender, non-distended EXTREMITIES:  No edema; No deformity   ASSESSMENT AND PLAN:   Aortic atherosclerosis: He will continue with aggressive risk reduction as below.   Elevated coronary  calcium : He has no new symptoms since his treadmill test.  No further testing is indicated.  Obesity: He has lost some weight.  I encouraged more of the same.   Dyslipidemia: Earlier this year his LDL was back up to 136.  It had gone down nicely with Lipitor.  I am gena switch him to Crestor 40 mg daily and repeat a lipid profile in 3 months.  He might need PCSK9.  Goal LDL is 50s.  Hypertension: Blood pressure is at target.  No change in therapy.   Tobacco: Unfortunately though he is not smoking cigarettes he is using Gyns.  We talked about the need to stop nicotine altogether.  He is not there yet.    Follow up with me in two years.   Signed, Eilleen Grates, MD

## 2023-07-27 ENCOUNTER — Encounter: Payer: Self-pay | Admitting: Cardiology

## 2023-07-27 ENCOUNTER — Ambulatory Visit: Admitting: Cardiology

## 2023-07-27 VITALS — BP 134/88 | HR 89 | Ht 75.0 in | Wt 296.0 lb

## 2023-07-27 DIAGNOSIS — E785 Hyperlipidemia, unspecified: Secondary | ICD-10-CM | POA: Diagnosis not present

## 2023-07-27 DIAGNOSIS — I700 Atherosclerosis of aorta: Secondary | ICD-10-CM

## 2023-07-27 DIAGNOSIS — R931 Abnormal findings on diagnostic imaging of heart and coronary circulation: Secondary | ICD-10-CM | POA: Diagnosis not present

## 2023-07-27 MED ORDER — ROSUVASTATIN CALCIUM 40 MG PO TABS: 40.0000 mg | ORAL_TABLET | Freq: Every day | ORAL | 3 refills | Status: AC

## 2023-07-27 NOTE — Patient Instructions (Addendum)
 Medication Instructions:    STOP Atorvastatin    START Crestor 40 mg daily  Labwork: Fasting Lipids in 3 months  Testing/Procedures: none  Follow-Up: 2 years Dr.Hochrein  Any Other Special Instructions Will Be Listed Below (If Applicable).  If you need a refill on your cardiac medications before your next appointment, please call your pharmacy.  Thank you for choosing [facility] !

## 2023-08-10 ENCOUNTER — Ambulatory Visit: Payer: Self-pay | Admitting: Family Medicine

## 2023-12-05 ENCOUNTER — Encounter: Payer: Self-pay | Admitting: Family Medicine

## 2023-12-05 ENCOUNTER — Ambulatory Visit: Admitting: Family Medicine

## 2023-12-05 VITALS — BP 128/82 | HR 84 | Temp 98.3°F | Wt 304.0 lb

## 2023-12-05 DIAGNOSIS — I10 Essential (primary) hypertension: Secondary | ICD-10-CM

## 2023-12-05 DIAGNOSIS — E785 Hyperlipidemia, unspecified: Secondary | ICD-10-CM | POA: Diagnosis not present

## 2023-12-05 DIAGNOSIS — J432 Centrilobular emphysema: Secondary | ICD-10-CM

## 2023-12-05 DIAGNOSIS — Z23 Encounter for immunization: Secondary | ICD-10-CM

## 2023-12-05 DIAGNOSIS — R931 Abnormal findings on diagnostic imaging of heart and coronary circulation: Secondary | ICD-10-CM

## 2023-12-05 DIAGNOSIS — N401 Enlarged prostate with lower urinary tract symptoms: Secondary | ICD-10-CM

## 2023-12-05 DIAGNOSIS — E1169 Type 2 diabetes mellitus with other specified complication: Secondary | ICD-10-CM | POA: Diagnosis not present

## 2023-12-05 DIAGNOSIS — I700 Atherosclerosis of aorta: Secondary | ICD-10-CM

## 2023-12-05 DIAGNOSIS — I2510 Atherosclerotic heart disease of native coronary artery without angina pectoris: Secondary | ICD-10-CM

## 2023-12-05 DIAGNOSIS — Z7984 Long term (current) use of oral hypoglycemic drugs: Secondary | ICD-10-CM

## 2023-12-05 LAB — HEPATIC FUNCTION PANEL
ALT: 43 U/L (ref 0–53)
AST: 35 U/L (ref 0–37)
Albumin: 4.8 g/dL (ref 3.5–5.2)
Alkaline Phosphatase: 63 U/L (ref 39–117)
Bilirubin, Direct: 0.2 mg/dL (ref 0.0–0.3)
Total Bilirubin: 0.8 mg/dL (ref 0.2–1.2)
Total Protein: 7.6 g/dL (ref 6.0–8.3)

## 2023-12-05 LAB — LDL CHOLESTEROL, DIRECT: Direct LDL: 75 mg/dL

## 2023-12-05 LAB — MICROALBUMIN / CREATININE URINE RATIO
Creatinine,U: 95.3 mg/dL
Microalb Creat Ratio: 19.7 mg/g (ref 0.0–30.0)
Microalb, Ur: 1.9 mg/dL (ref 0.0–1.9)

## 2023-12-05 LAB — HEMOGLOBIN A1C: Hgb A1c MFr Bld: 6.9 % — ABNORMAL HIGH (ref 4.6–6.5)

## 2023-12-05 MED ORDER — TAMSULOSIN HCL 0.4 MG PO CAPS: 0.4000 mg | ORAL_CAPSULE | Freq: Every day | ORAL | 1 refills | Status: AC

## 2023-12-05 MED ORDER — HYDROCHLOROTHIAZIDE 25 MG PO TABS: 25.0000 mg | ORAL_TABLET | Freq: Every day | ORAL | 1 refills | Status: AC

## 2023-12-05 MED ORDER — AMLODIPINE BESYLATE 10 MG PO TABS: 10.0000 mg | ORAL_TABLET | Freq: Every day | ORAL | 1 refills | Status: AC

## 2023-12-05 MED ORDER — LISINOPRIL 40 MG PO TABS: 40.0000 mg | ORAL_TABLET | Freq: Every day | ORAL | 1 refills | Status: AC

## 2023-12-05 MED ORDER — MELOXICAM 15 MG PO TABS: 15.0000 mg | ORAL_TABLET | Freq: Every day | ORAL | 1 refills | Status: AC

## 2023-12-05 MED ORDER — EMPAGLIFLOZIN 10 MG PO TABS: 10.0000 mg | ORAL_TABLET | Freq: Every day | ORAL | 1 refills | Status: AC

## 2023-12-05 NOTE — Patient Instructions (Addendum)
 Return in about 20 weeks (around 04/23/2024) for Routine chronic condition follow-up.        Great to see you today.  I have refilled the medication(s) we provide.   If labs were collected or images ordered, we will inform you of  results once we have received them and reviewed. We will contact you either by echart message, or telephone call.  Please give ample time to the testing facility, and our office to run,  receive and review results. Please do not call inquiring of results, even if you can see them in your chart. We will contact you as soon as we are able. If it has been over 1 week since the test was completed, and you have not yet heard from us , then please call us .    - echart message- for normal results that have been seen by the patient already.   - telephone call: abnormal results or if patient has not viewed results in their echart.  If a referral to a specialist was entered for you, please call us  in 2 weeks if you have not heard from the specialist office to schedule.

## 2023-12-05 NOTE — Progress Notes (Signed)
 Patient ID: Ryan Fisher, male  DOB: 02-15-1971, 53 y.o.   MRN: [id number] Patient Care Team    Relationship Specialty Notifications Start End  Catherine Charlies LABOR, DO PCP - General Family Medicine  08/30/16   Vicci Mcardle, OD  Optometry  08/04/20   Porter Andrez JONELLE DEVONNA (Inactive) Physician Assistant Dermatology  01/27/21   [place] GAILS, DO Consulting Physician Gastroenterology  05/02/22     Chief Complaint  Patient presents with   Hypertension   Hyperlipidemia   Diabetes    Influenza vaccine ophthalmology records- rr    Subjective: Ryan Fisher is a 53 y.o. male present for Chronic Conditions/illness Management All past medical history, surgical history, allergies, family history, immunizations, medications and social history were updated in the electronic medical record today. All recent labs, ED visits and hospitalizations within the last year were reviewed.  diabetes type 2/morbid obesity: Diagnosed 04/06/2020 Pt reports compliance with Jardiance  10 mg daily Patient denies dizziness, hyperglycemic or hypoglycemic events. Patient denies numbness, tingling in the extremities or nonhealing wounds of feet.   Hypertension/HLD/Obesity:  Pt reports compliance with lisinopril  40 mg daily and amlodipine  10 mg daily. Patient denies chest pain, shortness of breath, dizziness or lower extremity edema.   Pt does not take a daily baby ASA. Pt is  taking his statin daily.  Labs due Diet: low sodium Exercise: not routinely RF: HTN, obesity, Fhx HD.  Daily smoker.    BPH: Pt reports his condition is stable with flomax  use   Exercise tolerance test 07/19/2022   Patient exercised for 8 minutes achieve 10.1 METS of activity.   Occasional PVCs noted during stress.   No ST segment elevation or depression   Low risk exercise treadmill test with no electrocardiographic evidence of ischemia.     12/05/2023    1:38 PM 06/20/2023    1:55 PM 10/17/2022    3:52 PM 05/02/2022    3:30 PM  12/30/2020    2:45 PM  Depression screen PHQ 2/9  Decreased Interest 0 0 0 0 0  Down, Depressed, Hopeless 1 0 0 0 0  PHQ - 2 Score 1 0 0 0 0  Altered sleeping 0 1     Tired, decreased energy 0 0     Change in appetite 1 0     Feeling bad or failure about yourself  1 0     Trouble concentrating 0 0     Moving slowly or fidgety/restless 1 0     Suicidal thoughts 0 0     PHQ-9 Score 4 1     Difficult doing work/chores Somewhat difficult Not difficult at all         12/05/2023    1:39 PM 06/20/2023    1:55 PM 05/02/2022    3:30 PM  GAD 7 : Generalized Anxiety Score  Nervous, Anxious, on Edge 1 0 0  Control/stop worrying 0 0 0  Worry too much - different things 0 0 0  Trouble relaxing 1 1 0  Restless 0 0 0  Easily annoyed or irritable 1 0 0  Afraid - awful might happen 0 0 0  Total GAD 7 Score 3 1 0  Anxiety Difficulty Somewhat difficult Not difficult at all           12/05/2023    1:38 PM 06/20/2023    1:55 PM 10/17/2022    3:51 PM 05/02/2022    3:30 PM 09/06/2016   10:34 AM  Fall  Risk   Falls in the past year? 0 0 0 0 No   Number falls in past yr: 0  0    Injury with Fall? 0  0    Risk for fall due to :   No Fall Risks No Fall Risks   Follow up Falls evaluation completed Falls evaluation completed Falls evaluation completed Falls evaluation completed      Data saved with a previous flowsheet row definition    Immunization History  Administered Date(s) Administered   Influenza,inj,Quad PF,6+ Mos 11/29/2018, 12/30/2020, 11/08/2021   Influenza-Unspecified 01/11/2020   PFIZER(Purple Top)SARS-COV-2 Vaccination 06/10/2019, 07/02/2019, 02/19/2020   PNEUMOCOCCAL CONJUGATE-20 06/20/2023   Pneumococcal Polysaccharide-23 09/06/2016   Tdap 03/14/2013, 06/20/2023   Zoster Recombinant(Shingrix ) 05/04/2021, 07/26/2021   Past Medical History:  Diagnosis Date   Heel spur, right 04/26/2018   Hyperlipidemia    Hypertension    Smoking greater than 20 pack years 05/02/2022    Allergies  Allergen Reactions   Morphine And Codeine Other (See Comments)    Morphine only- sweating, flushing.    Past Surgical History:  Procedure Laterality Date   COLONOSCOPY     ROTATOR CUFF REPAIR Left 2012   left   Family History  Problem Relation Age of Onset   Arthritis Mother    Atrial fibrillation Mother    Osteoporosis Mother    Allergies Brother    Asthma Brother    Brain cancer Maternal Aunt    Early death Paternal Uncle    Heart failure Paternal Grandfather    Colon polyps Neg Hx    Colon cancer Neg Hx    Esophageal cancer Neg Hx    Stomach cancer Neg Hx    Rectal cancer Neg Hx    Social History   Social History Narrative   Married to [place].    Some college attendance. Works for [employer] as an Engineer, petroleum.    Drinks caffeine.    Wears a seatbelt and bicycle helmet.    Smoke detector in the home. Firearms locked in the home.    Feels safe in his relationships.     Allergies as of 12/05/2023       Reactions   Morphine And Codeine Other (See Comments)   Morphine only- sweating, flushing.         Medication List        Accurate as of December 05, 2023  1:52 PM. If you have any questions, ask your nurse or doctor.          amLODipine  10 MG tablet Commonly known as: NORVASC  Take 1 tablet (10 mg total) by mouth daily.   empagliflozin  10 MG Tabs tablet Commonly known as: Jardiance  Take 1 tablet (10 mg total) by mouth daily.   hydrochlorothiazide  25 MG tablet Commonly known as: HYDRODIURIL  Take 1 tablet (25 mg total) by mouth daily.   lisinopril  40 MG tablet Commonly known as: ZESTRIL  Take 1 tablet (40 mg total) by mouth daily.   meloxicam  15 MG tablet Commonly known as: MOBIC  Take 1 tablet (15 mg total) by mouth daily.   penicillin v potassium 500 MG tablet Commonly known as: VEETID Take by mouth.   rosuvastatin  40 MG tablet Commonly known as: CRESTOR  Take 1 tablet (40 mg total) by mouth daily.   tamsulosin  0.4 MG Caps  capsule Commonly known as: FLOMAX  Take 1 capsule (0.4 mg total) by mouth daily.       All past medical history, surgical history, allergies, family history, immunizations andmedications  were updated in the EMR today and reviewed under the history and medication portions of their EMR.       ROS 14 pt review of systems performed and negative (unless mentioned in an HPI)  Objective: BP 128/82   Pulse 84   Temp 98.3 F (36.8 C)   Wt (!) 304 lb (137.9 kg)   SpO2 97%   BMI 38.00 kg/m  Physical Exam Vitals and nursing note reviewed. Exam conducted with a chaperone present.  Constitutional:      General: He is not in acute distress.    Appearance: Normal appearance. He is not ill-appearing, toxic-appearing or diaphoretic.  HENT:     Head: Normocephalic and atraumatic.  Eyes:     General: No scleral icterus.       Right eye: No discharge.        Left eye: No discharge.     Extraocular Movements: Extraocular movements intact.     Pupils: Pupils are equal, round, and reactive to light.  Cardiovascular:     Rate and Rhythm: Normal rate and regular rhythm.     Heart sounds: No murmur heard. Pulmonary:     Effort: Pulmonary effort is normal. No respiratory distress.     Breath sounds: Normal breath sounds. No wheezing, rhonchi or rales.  Musculoskeletal:     Right lower leg: No edema.     Left lower leg: No edema.  Skin:    General: Skin is warm.     Findings: No rash.  Neurological:     Mental Status: He is alert and oriented to person, place, and time. Mental status is at baseline.  Psychiatric:        Mood and Affect: Mood normal.        Behavior: Behavior normal.        Thought Content: Thought content normal.        Judgment: Judgment normal.    Diabetic Foot Exam - Simple   Simple Foot Form Diabetic Foot exam was performed with the following findings: Yes 12/05/2023  1:44 PM  Visual Inspection No deformities, no ulcerations, no other skin breakdown bilaterally:  Yes Sensation Testing Intact to touch and monofilament testing bilaterally: Yes Pulse Check Posterior Tibialis and Dorsalis pulse intact bilaterally: Yes Comments     No results found.  Assessment/plan: Ryan Fisher is a 53 y.o. male present for CPE and chronic condition management type 2 diabetes mellitus (HCC) with HLD Stable Continue Jardiance  10 mg daily Continue Crestor  40 mg nightly -Goal A1c <7, closer to 6.5 would be best -Patient was instructed on normal glucose range and glucose testing. -Patient was educated on diabetic diet and glycemic index goals. PNA series: Completed 06/20/2023 Flu shot:  (recommneded yearly) administered today Foot exam: Completed 12/05/2023 Eye exam: Up-to-date 2//2025    [facility]  A1c: 9.1>5.3> 6.0> 6.1 > 6.3>5.5 > 6.1 >A1c collected today  Microalb collected today  Essential hypertension/Morbid obesity (HCC)/HLD/Obesity, Class II, BMI 35-39.9/elevated coronary calcium  score Stable Continue amlodipine  to 10 mg Continue lisinopril  to 40 mg QD.  Continue HCTZ 25 mg qd   -He was counseled on low-sodium diet, routine exercise and routine follow-up. - continue atorvastatin  Coronary evaluation by lung cancer screening: Aortic atherosclerosis, as well as atherosclerosis of the great vessels of the mediastinum and the coronary arteries, including calcified atherosclerotic plaque in the left main, left anterior descending and left circumflex coronary arteries. Exercise tolerance test 07/19/2022-normal study Switch from Lipitor to Crestor  40 mg daily by  cardiology-every 2 year follow-up with cardiology  BPH:  Stable Continue flomax    Lumbar DDD: Stable Continue Mobic  15 mg daily   Return in about 20 weeks (around 04/23/2024) for Routine chronic condition follow-up.   Orders Placed This Encounter  Procedures   Flu vaccine trivalent PF, 6mos and older(Flulaval,Afluria,Fluarix,Fluzone)   Urine Microalbumin w/creat. ratio   Direct LDL    Hepatic function panel   Hemoglobin A1c   Meds ordered this encounter  Medications   amLODipine  (NORVASC ) 10 MG tablet    Sig: Take 1 tablet (10 mg total) by mouth daily.    Dispense:  90 tablet    Refill:  1   empagliflozin  (JARDIANCE ) 10 MG TABS tablet    Sig: Take 1 tablet (10 mg total) by mouth daily.    Dispense:  90 tablet    Refill:  1   hydrochlorothiazide  (HYDRODIURIL ) 25 MG tablet    Sig: Take 1 tablet (25 mg total) by mouth daily.    Dispense:  90 tablet    Refill:  1   lisinopril  (ZESTRIL ) 40 MG tablet    Sig: Take 1 tablet (40 mg total) by mouth daily.    Dispense:  90 tablet    Refill:  1   meloxicam  (MOBIC ) 15 MG tablet    Sig: Take 1 tablet (15 mg total) by mouth daily.    Dispense:  90 tablet    Refill:  1   tamsulosin  (FLOMAX ) 0.4 MG CAPS capsule    Sig: Take 1 capsule (0.4 mg total) by mouth daily.    Dispense:  90 capsule    Refill:  1   Referral Orders  No referral(s) requested today      Note is dictated utilizing voice recognition software. Although note has been proof read prior to signing, occasional typographical errors still can be missed. If any questions arise, please do not hesitate to call for verification.  Electronically signed by: Charlies Bellini, DO [facility]

## 2023-12-06 ENCOUNTER — Ambulatory Visit: Payer: Self-pay | Admitting: Family Medicine

## 2023-12-13 ENCOUNTER — Other Ambulatory Visit (HOSPITAL_COMMUNITY): Payer: Self-pay

## 2024-02-01 ENCOUNTER — Ambulatory Visit: Payer: Self-pay

## 2024-02-01 ENCOUNTER — Telehealth: Admitting: Physician Assistant

## 2024-02-01 DIAGNOSIS — M545 Low back pain, unspecified: Secondary | ICD-10-CM | POA: Diagnosis not present

## 2024-02-01 MED ORDER — METHOCARBAMOL 500 MG PO TABS: 500.0000 mg | ORAL_TABLET | Freq: Four times a day (QID) | ORAL | 0 refills | Status: AC

## 2024-02-01 NOTE — Progress Notes (Signed)
 Virtual Visit Consent   Ryan Fisher, you are scheduled for a virtual visit with a [facility] provider today. Just as with appointments in the office, your consent must be obtained to participate. Your consent will be active for this visit and any virtual visit you may have with one of our providers in the next 365 days. If you have a MyChart account, a copy of this consent can be sent to you electronically.  As this is a virtual visit, video technology does not allow for your provider to perform a traditional examination. This may limit your provider's ability to fully assess your condition. If your provider identifies any concerns that need to be evaluated in person or the need to arrange testing (such as labs, EKG, etc.), we will make arrangements to do so. Although advances in technology are sophisticated, we cannot ensure that it will always work on either your end or our end. If the connection with a video visit is poor, the visit may have to be switched to a telephone visit. With either a video or telephone visit, we are not always able to ensure that we have a secure connection.  By engaging in this virtual visit, you consent to the provision of healthcare and authorize for your insurance to be billed (if applicable) for the services provided during this visit. Depending on your insurance coverage, you may receive a charge related to this service.  I need to obtain your verbal consent now. Are you willing to proceed with your visit today? Ryan Fisher has provided verbal consent on 02/01/2024 for a virtual visit (video or telephone). Teena Shuck, [place]  Date: 02/01/2024 4:19 PM   Virtual Visit via Video Note   I, Ashani Pumphrey Shuck, connected with  Ryan Fisher  ([id number], 1970/11/29) on 02/01/24 at  4:15 PM EST by a video-enabled telemedicine application and verified that I am speaking with the correct person using two identifiers.  Location: Patient: Virtual Visit Location Patient:  Home Provider: Virtual Visit Location Provider: Home Office   I discussed the limitations of evaluation and management by telemedicine and the availability of in person appointments. The patient expressed understanding and agreed to proceed.    History of Present Illness: Ryan Fisher is a 53 y.o. who identifies as a male who was assigned male at birth, and is being seen today for patient states about 2 weeks prior he bent over and felt a pain in his back. He states the pain has gotten worst. He know has to use a tens device to get up.  He states the pain is rated at 5/10 and at worst its a 10/10. Denies loss of bowel bladder, fever or any other red flag symptoms.   HPI: HPI  Problems:  Patient Active Problem List   Diagnosis Date Noted   Elevated coronary artery calcium  score 07/04/2022   Atherosclerosis of aorta 05/16/2022   Centrilobular and paraseptal emphysema (HCC) 05/16/2022   Coronary artery disease involving native coronary artery of native heart without angina pectoris 05/16/2022   Lumbar degenerative disc disease 11/11/2021   Morbid obesity (HCC) 05/04/2021   Type 2 diabetes mellitus with hyperlipidemia (HCC) 04/07/2020   BPH associated with nocturia 06/12/2019   Hyperlipidemia LDL goal <100 09/13/2016   Essential hypertension 08/30/2016   Former smoker 08/30/2016    Allergies:  Allergies  Allergen Reactions   Morphine And Codeine Other (See Comments)    Morphine only- sweating, flushing.    Medications:  Current Outpatient Medications:  amLODipine  (NORVASC ) 10 MG tablet, Take 1 tablet (10 mg total) by mouth daily., Disp: 90 tablet, Rfl: 1   empagliflozin  (JARDIANCE ) 10 MG TABS tablet, Take 1 tablet (10 mg total) by mouth daily., Disp: 90 tablet, Rfl: 1   hydrochlorothiazide  (HYDRODIURIL ) 25 MG tablet, Take 1 tablet (25 mg total) by mouth daily., Disp: 90 tablet, Rfl: 1   lisinopril  (ZESTRIL ) 40 MG tablet, Take 1 tablet (40 mg total) by mouth daily., Disp: 90 tablet,  Rfl: 1   meloxicam  (MOBIC ) 15 MG tablet, Take 1 tablet (15 mg total) by mouth daily., Disp: 90 tablet, Rfl: 1   penicillin v potassium (VEETID) 500 MG tablet, Take by mouth., Disp: , Rfl:    rosuvastatin  (CRESTOR ) 40 MG tablet, Take 1 tablet (40 mg total) by mouth daily., Disp: 90 tablet, Rfl: 3   tamsulosin  (FLOMAX ) 0.4 MG CAPS capsule, Take 1 capsule (0.4 mg total) by mouth daily., Disp: 90 capsule, Rfl: 1  Observations/Objective: Patient is well-developed, well-nourished in no acute distress.  Resting comfortably  at home.  Head is normocephalic, atraumatic.  No labored breathing.  Speech is clear and coherent with logical content.  Patient is alert and oriented at baseline.    Assessment and Plan: 1. Lumbar pain (Primary)   Patient presenting with lower right back pain. No evidence for cauda equina, spinal infection, bony injury, other significant pathology.  The patient did not have any urinary incontinence, bowel incontinence, saddle anesthesia, fever, or weight loss that would advanced warrant imaging. Patient was advised to follow up with primary provider 3-5 days if continuing to have  pain. Advised to present to ER if signs of cauda equina or spinal infection develop including loss of bowel or bladder function, peripheral numbness/weakness/tingling, significant fevers, or other concerns.     Provided muscle relaxer for spasms.  Advised patient on supportive therapies, including rest, relaxation techniques, heat application, weight loss, ergonomic therapies, and refraining from lifting heavy objects. Instructed patient on low back pain ROM exercises. Warning symptoms discussed which would prompt return.  Follow Up Instructions: I discussed the assessment and treatment plan with the patient. The patient was provided an opportunity to ask questions and all were answered. The patient agreed with the plan and demonstrated an understanding of the instructions.  A copy of instructions  were sent to the patient via MyChart unless otherwise noted below.     The patient was advised to call back or seek an in-person evaluation if the symptoms worsen or if the condition Fisher to improve as anticipated.    Teena Shuck, PA-C

## 2024-02-01 NOTE — Patient Instructions (Signed)
  Ryan Fisher, thank you for joining Teena Shuck, PA-C for today's virtual visit.  While this provider is not your primary care provider (PCP), if your PCP is located in our provider database this encounter information will be shared with them immediately following your visit.   A [facility] MyChart account gives you access to today's visit and all your visits, tests, and labs performed at [facility]  click here if you don't have a [facility] MyChart account or go to [email or web address]  Consent: (Patient) Ryan Fisher provided verbal consent for this virtual visit at the beginning of the encounter.  Current Medications:  Current Outpatient Medications:    methocarbamol (ROBAXIN) 500 MG tablet, Take 1 tablet (500 mg total) by mouth 4 (four) times daily for 7 days., Disp: 28 tablet, Rfl: 0   amLODipine  (NORVASC ) 10 MG tablet, Take 1 tablet (10 mg total) by mouth daily., Disp: 90 tablet, Rfl: 1   empagliflozin  (JARDIANCE ) 10 MG TABS tablet, Take 1 tablet (10 mg total) by mouth daily., Disp: 90 tablet, Rfl: 1   hydrochlorothiazide  (HYDRODIURIL ) 25 MG tablet, Take 1 tablet (25 mg total) by mouth daily., Disp: 90 tablet, Rfl: 1   lisinopril  (ZESTRIL ) 40 MG tablet, Take 1 tablet (40 mg total) by mouth daily., Disp: 90 tablet, Rfl: 1   meloxicam  (MOBIC ) 15 MG tablet, Take 1 tablet (15 mg total) by mouth daily., Disp: 90 tablet, Rfl: 1   penicillin v potassium (VEETID) 500 MG tablet, Take by mouth., Disp: , Rfl:    rosuvastatin  (CRESTOR ) 40 MG tablet, Take 1 tablet (40 mg total) by mouth daily., Disp: 90 tablet, Rfl: 3   tamsulosin  (FLOMAX ) 0.4 MG CAPS capsule, Take 1 capsule (0.4 mg total) by mouth daily., Disp: 90 capsule, Rfl: 1   Medications ordered in this encounter:  Meds ordered this encounter  Medications   methocarbamol (ROBAXIN) 500 MG tablet    Sig: Take 1 tablet (500 mg total) by mouth 4 (four) times daily for 7 days.    Dispense:  28 tablet    Refill:  0     Supervising Provider:   BLAISE ALEENE KIDD [[id number]]     *If you need refills on other medications prior to your next appointment, please contact your pharmacy*  Follow-Up: Call back or seek an in-person evaluation if the symptoms worsen or if the condition fails to improve as anticipated.  [facility] [phone]  Other Instructions `Follow up with primary provider in 24-48 hours. Report to nearest ER with any worsening symptoms.    If you have been instructed to have an in-person evaluation today at a local Urgent Care facility, please use the link below. It will take you to a list of all of our available [facility], including address, phone number and hours of operation. Please do not delay care.  [facility]  If you or a family member do not have a primary care provider, use the link below to schedule a visit and establish care. When you choose a [facility] primary care physician or advanced practice provider, you gain a long-term partner in health. Find a Primary Care Provider  Learn more about [facility] in-office and virtual care options: [facility] - Get Care Now

## 2024-02-01 NOTE — Telephone Encounter (Signed)
 First attempt to contact patient for triage.  LVM for patient to return call to [phone].  Placed back in triage queue for additional attempts.   Reason for Triage: pt has back pain. Was able to schedule tomorrow at 7:40am but would like to be seen sooner. Call back number [phone]

## 2024-02-01 NOTE — Telephone Encounter (Signed)
 noted

## 2024-02-01 NOTE — Telephone Encounter (Signed)
 FYI Only or Action Required?: FYI only for provider: appointment scheduled on 02/02/2024.  Patient was last seen in primary care on 12/05/2023 by Catherine Fuller A, DO.  Called Nurse Triage reporting Back Pain.  Symptoms began several weeks ago.  Interventions attempted: OTC medications: tylenol, advil.  Symptoms are: gradually worsening.  Triage Disposition: See PCP When Office is Open (Within 3 Days)  Patient/caregiver understands and will follow disposition?: Yes       Reason for Triage: pt has back pain. Was able to schedule tomorrow at 7:40am but would like to be seen sooner. Call back number [phone]     Reason for Disposition  [1] MODERATE back pain (e.g., interferes with normal activities) AND [2] present > 3 days  Answer Assessment - Initial Assessment Questions 1. ONSET: When did the pain begin? (e.g., minutes, hours, days)     Two weeks ago 2. LOCATION: Where does it hurt? (upper, mid or lower back)     Low back on left 3. SEVERITY: How bad is the pain?  (e.g., Scale 1-10; mild, moderate, or severe)     5/10 4. PATTERN: Is the pain constant? (e.g., yes, no; constant, intermittent)      Constant when not wearing TENS unit 5. RADIATION: Does the pain shoot into your legs or somewhere else?     Toward stomach 6. CAUSE:  What do you think is causing the back pain?      Felt a pull doing yard work about two weeks ago 7. BACK OVERUSE:  Any recent lifting of heavy objects, strenuous work or exercise?     Two weeks ago, kneeled down doing yard work 8. MEDICINES: What have you taken so far for the pain? (e.g., nothing, acetaminophen, NSAIDS)     Tylenol, ibuprofen with no relief 9. NEUROLOGIC SYMPTOMS: Do you have any weakness, numbness, or problems with bowel/bladder control?     denies 10. OTHER SYMPTOMS: Do you have any other symptoms? (e.g., fever, abdomen pain, burning with urination, blood in urine)       none  Protocols used: Back  Pain-A-AH

## 2024-02-02 ENCOUNTER — Ambulatory Visit: Admitting: Family Medicine

## 2024-02-02 ENCOUNTER — Encounter: Payer: Self-pay | Admitting: Family Medicine

## 2024-02-02 VITALS — BP 126/82 | HR 79 | Temp 98.2°F | Wt 312.6 lb

## 2024-02-02 DIAGNOSIS — M5136 Other intervertebral disc degeneration, lumbar region with discogenic back pain only: Secondary | ICD-10-CM

## 2024-02-02 MED ORDER — CYCLOBENZAPRINE HCL 10 MG PO TABS: 5.0000 mg | ORAL_TABLET | Freq: Three times a day (TID) | ORAL | 1 refills | Status: AC | PRN

## 2024-02-02 MED ORDER — TRAMADOL HCL 50 MG PO TABS: 100.0000 mg | ORAL_TABLET | Freq: Three times a day (TID) | ORAL | 0 refills | Status: AC | PRN

## 2024-02-02 MED ORDER — METHYLPREDNISOLONE 4 MG PO TBPK: ORAL_TABLET | ORAL | 0 refills | Status: AC

## 2024-02-02 MED ORDER — KETOROLAC TROMETHAMINE 60 MG/2ML IM SOLN
60.0000 mg | Freq: Once | INTRAMUSCULAR | Status: AC
  Administered 2024-02-02: 60 mg via INTRAMUSCULAR

## 2024-02-02 NOTE — Patient Instructions (Addendum)
 Return in about 2 weeks (around 02/16/2024) for Lumbar strain.        Great to see you today.  I have refilled the medication(s) we provide.   If labs were collected or images ordered, we will inform you of  results once we have received them and reviewed. We will contact you either by echart message, or telephone call.  Please give ample time to the testing facility, and our office to run,  receive and review results. Please do not call inquiring of results, even if you can see them in your chart. We will contact you as soon as we are able. If it has been over 1 week since the test was completed, and you have not yet heard from us , then please call us .    - echart message- for normal results that have been seen by the patient already.   - telephone call: abnormal results or if patient has not viewed results in their echart.  If a referral to a specialist was entered for you, please call us  in 2 weeks if you have not heard from the specialist office to schedule.

## 2024-02-02 NOTE — Progress Notes (Signed)
 Ryan Fisher , October 02, 1970, 53 y.o., male MRN: [id number] Patient Care Team    Relationship Specialty Notifications Start End  Catherine Charlies LABOR, DO PCP - General Family Medicine  08/30/16   Vicci Mcardle, OD  Optometry  08/04/20   Ryan Fisher (Inactive) Physician Assistant Dermatology  01/27/21   [place]or GAILS, DO Consulting Physician Gastroenterology  05/02/22     Chief Complaint  Patient presents with   Lumbar strain    Worsening over 2 weeks. Pt has tried Ibuprofen and Tylenol.      Subjective: Ryan Fisher is a 53 y.o. Pt presents for an OV with complaints of greater than 2 weeks of low back pain, that is now worsening.  Patient reports he bent over and felt a strain in his back.  He has had similar presentation on 2 occasions over the last 5 years.  X-ray completed 2023 did show multilevel anterior osteophytosis with degenerative disc disease of the lumbar spine most pronounced at L5-S1.  There are also presence of lumbar spine facet degeneration.  Associated symptoms include pain across lower back without radiation to lower extremities.  No bladder or bowel dysfunction.  Pt has tried Robaxin  to ease their symptoms.  Which was prescribed during a telehealth visit yesterday.  He reports it was somewhat helpful last night, but he still woke up multiple times with back pain.   X-ray 11/11/2021: Normal anatomic alignment. No evidence for acute fracture or dislocation.  Multilevel anterior osteophytosis. Degenerative disc disease most pronounced at L5-S1. Lower lumbar spine facet degenerative changes.  SI joints unremarkable.   IMPRESSION: Bulky anterior endplate osteophytosis. Lower lumbar spine degenerative disc and facet disease.     12/05/2023    1:38 PM 06/20/2023    1:55 PM 10/17/2022    3:52 PM 05/02/2022    3:30 PM 12/30/2020    2:45 PM  Depression screen PHQ 2/9  Decreased Interest 0 0 0 0 0  Down, Depressed, Hopeless 1 0 0 0 0  PHQ - 2 Score 1 0 0 0 0   Altered sleeping 0 1     Tired, decreased energy 0 0     Change in appetite 1 0     Feeling bad or failure about yourself  1 0     Trouble concentrating 0 0     Moving slowly or fidgety/restless 1 0     Suicidal thoughts 0 0     PHQ-9 Score 4  1      Difficult doing work/chores Somewhat difficult Not difficult at all        Data saved with a previous flowsheet row definition    Allergies  Allergen Reactions   Morphine And Codeine Other (See Comments)    Morphine only- sweating, flushing.    Social History   Social History Narrative   Married to [employer].    Some college attendance. Works for [employer] as an engineer, petroleum.    Drinks caffeine.    Wears a seatbelt and bicycle helmet.    Smoke detector in the home. Firearms locked in the home.    Feels safe in his relationships.    Past Medical History:  Diagnosis Date   Heel spur, right 04/26/2018   Hyperlipidemia    Hypertension    Smoking greater than 20 pack years 05/02/2022   Past Surgical History:  Procedure Laterality Date   COLONOSCOPY     ROTATOR CUFF REPAIR Left 2012   left  Family History  Problem Relation Age of Onset   Arthritis Mother    Atrial fibrillation Mother    Osteoporosis Mother    Allergies Brother    Asthma Brother    Brain cancer Maternal Aunt    Early death Paternal Uncle    Heart failure Paternal Grandfather    Colon polyps Neg Hx    Colon cancer Neg Hx    Esophageal cancer Neg Hx    Stomach cancer Neg Hx    Rectal cancer Neg Hx    Allergies as of 02/02/2024       Reactions   Morphine And Codeine Other (See Comments)   Morphine only- sweating, flushing.         Medication List        Accurate as of February 02, 2024  7:56 AM. If you have any questions, ask your nurse or doctor.          amLODipine  10 MG tablet Commonly known as: NORVASC  Take 1 tablet (10 mg total) by mouth daily.   cyclobenzaprine  10 MG tablet Commonly known as: FLEXERIL  Take 0.5-1 tablets (5-10 mg  total) by mouth 3 (three) times daily as needed for muscle spasms. Started by: Charlies Bellini   empagliflozin  10 MG Tabs tablet Commonly known as: Jardiance  Take 1 tablet (10 mg total) by mouth daily.   hydrochlorothiazide  25 MG tablet Commonly known as: HYDRODIURIL  Take 1 tablet (25 mg total) by mouth daily.   lisinopril  40 MG tablet Commonly known as: ZESTRIL  Take 1 tablet (40 mg total) by mouth daily.   meloxicam  15 MG tablet Commonly known as: MOBIC  Take 1 tablet (15 mg total) by mouth daily.   methocarbamol  500 MG tablet Commonly known as: ROBAXIN  Take 1 tablet (500 mg total) by mouth 4 (four) times daily for 7 days.   methylPREDNISolone  4 MG Tbpk tablet Commonly known as: MEDROL  DOSEPAK Taper p.o. per package directions Started by: Denney Shein   penicillin v potassium 500 MG tablet Commonly known as: VEETID Take by mouth.   rosuvastatin  40 MG tablet Commonly known as: CRESTOR  Take 1 tablet (40 mg total) by mouth daily.   tamsulosin  0.4 MG Caps capsule Commonly known as: FLOMAX  Take 1 capsule (0.4 mg total) by mouth daily.   traMADol  50 MG tablet Commonly known as: ULTRAM  Take 2 tablets (100 mg total) by mouth every 8 (eight) hours as needed for up to 7 days for moderate pain (pain score 4-6) or severe pain (pain score 7-10). Started by: Charlies Bellini        All past medical history, surgical history, allergies, family history, immunizations andmedications were updated in the EMR today and reviewed under the history and medication portions of their EMR.     ROS Negative, with the exception of above mentioned in HPI   Objective:  BP 126/82   Pulse 79   Temp 98.2 F (36.8 C)   Wt (!) 312 lb 9.6 oz (141.8 kg)   SpO2 97%   BMI 39.07 kg/m  Body mass index is 39.07 kg/m.  Physical Exam Vitals and nursing note reviewed. Exam conducted with a chaperone present.  Constitutional:      General: He is not in acute distress.    Appearance: Normal  appearance. He is not ill-appearing, toxic-appearing or diaphoretic.  HENT:     Head: Normocephalic and atraumatic.  Eyes:     General: No scleral icterus.       Right eye: No discharge.  Left eye: No discharge.     Extraocular Movements: Extraocular movements intact.     Pupils: Pupils are equal, round, and reactive to light.  Cardiovascular:     Rate and Rhythm: Normal rate and regular rhythm.  Musculoskeletal:     Lumbar back: Spasms and tenderness present. No bony tenderness. Decreased range of motion. Negative right straight leg raise test and negative left straight leg raise test.  Skin:    General: Skin is warm and dry.     Coloration: Skin is not jaundiced or pale.     Findings: No rash.  Neurological:     Mental Status: He is alert and oriented to person, place, and time. Mental status is at baseline.  Psychiatric:        Mood and Affect: Mood normal.        Behavior: Behavior normal.        Thought Content: Thought content normal.        Judgment: Judgment normal.     No results found. No results found. No results found for this or any previous visit (from the past 24 hours).  Assessment/Plan: Ryan Fisher is a 53 y.o. male present for OV for  Degeneration of intervertebral disc of lumbar region with discogenic back pain (Primary) Lumbar strain similar to past presentations.  No red flags on exam. - ketorolac  (TORADOL ) injection 60 mg -DC Robaxin , start Flexeril  5-10 mg 3 times daily as needed (this medication worked well for him in the past without oversedation) - Medrol  Dosepak prescribed -Tramadol  100 mg twice daily as needed Return in about 2 weeks (around 02/16/2024), or if symptoms worsen or fail to improve, for Lumbar strain.    Reviewed expectations re: course of current medical issues. Discussed self-management of symptoms. Outlined signs and symptoms indicating need for more acute intervention. Patient verbalized understanding and all questions  were answered. Patient received an After-Visit Summary.    No orders of the defined types were placed in this encounter.  Meds ordered this encounter  Medications   methylPREDNISolone  (MEDROL  DOSEPAK) 4 MG TBPK tablet    Sig: Taper p.o. per package directions    Dispense:  21 each    Refill:  0   traMADol  (ULTRAM ) 50 MG tablet    Sig: Take 2 tablets (100 mg total) by mouth every 8 (eight) hours as needed for up to 7 days for moderate pain (pain score 4-6) or severe pain (pain score 7-10).    Dispense:  42 tablet    Refill:  0   cyclobenzaprine  (FLEXERIL ) 10 MG tablet    Sig: Take 0.5-1 tablets (5-10 mg total) by mouth 3 (three) times daily as needed for muscle spasms.    Dispense:  90 tablet    Refill:  1   ketorolac  (TORADOL ) injection 60 mg   Referral Orders  No referral(s) requested today     Note is dictated utilizing voice recognition software. Although note has been proof read prior to signing, occasional typographical errors still can be missed. If any questions arise, please do not hesitate to call for verification.   electronically signed by:  Charlies Bellini, DO  [facility] [place]

## 2024-02-16 ENCOUNTER — Ambulatory Visit: Admitting: Family Medicine

## 2024-04-23 ENCOUNTER — Ambulatory Visit: Admitting: Family Medicine
